# Patient Record
Sex: Female | Born: 2000 | Race: White | Hispanic: No | Marital: Single | Smoking: Never smoker
Health system: Southern US, Community
[De-identification: ages and names within clinical notes are randomized; demographics above are authoritative.]

## PROBLEM LIST (undated history)

## (undated) ENCOUNTER — Emergency Department (HOSPITAL_COMMUNITY): Admission: EM | Payer: Medicaid Other | Source: Home / Self Care

## (undated) ENCOUNTER — Ambulatory Visit

## (undated) DIAGNOSIS — J45909 Unspecified asthma, uncomplicated: Secondary | ICD-10-CM

## (undated) DIAGNOSIS — L309 Dermatitis, unspecified: Secondary | ICD-10-CM

## (undated) HISTORY — DX: Dermatitis, unspecified: L30.9

---
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
## 2021-02-06 ENCOUNTER — Emergency Department (HOSPITAL_COMMUNITY)
Admission: EM | Admit: 2021-02-06 | Discharge: 2021-02-06 | Disposition: A | Discharge status: Home / Self Care | Payer: Medicaid Other | Attending: Emergency Medicine | Admitting: Emergency Medicine

## 2021-02-06 ENCOUNTER — Other Ambulatory Visit: Payer: Self-pay

## 2021-02-06 ENCOUNTER — Encounter (HOSPITAL_COMMUNITY): Payer: Self-pay

## 2021-02-06 DIAGNOSIS — X58XXXA Exposure to other specified factors, initial encounter: Secondary | ICD-10-CM | POA: Diagnosis not present

## 2021-02-06 DIAGNOSIS — R0602 Shortness of breath: Secondary | ICD-10-CM | POA: Insufficient documentation

## 2021-02-06 DIAGNOSIS — T7840XA Allergy, unspecified, initial encounter: Secondary | ICD-10-CM | POA: Diagnosis not present

## 2021-02-06 DIAGNOSIS — J4520 Mild intermittent asthma, uncomplicated: Secondary | ICD-10-CM | POA: Insufficient documentation

## 2021-02-06 DIAGNOSIS — Z7982 Long term (current) use of aspirin: Secondary | ICD-10-CM | POA: Insufficient documentation

## 2021-02-06 MED ORDER — FAMOTIDINE 20 MG PO TABS
20.0000 mg | ORAL_TABLET | Freq: Two times a day (BID) | ORAL | 0 refills | Status: DC
Start: 2021-02-06 — End: 2021-04-05

## 2021-02-06 MED ORDER — ONDANSETRON HCL 4 MG/2ML IJ SOLN
4.0000 mg | Freq: Once | INTRAMUSCULAR | Status: AC
  Administered 2021-02-06: 4 mg via INTRAVENOUS
  Filled 2021-02-06: qty 2

## 2021-02-06 MED ORDER — PREDNISONE 50 MG PO TABS
50.0000 mg | ORAL_TABLET | Freq: Every day | ORAL | 0 refills | Status: DC
Start: 2021-02-06 — End: 2021-04-05

## 2021-02-06 MED ORDER — FAMOTIDINE IN NACL 20-0.9 MG/50ML-% IV SOLN
20.0000 mg | Freq: Once | INTRAVENOUS | Status: AC
  Administered 2021-02-06: 20 mg via INTRAVENOUS
  Filled 2021-02-06: qty 50

## 2021-02-06 MED ORDER — DIPHENHYDRAMINE HCL 50 MG/ML IJ SOLN
25.0000 mg | Freq: Once | INTRAMUSCULAR | Status: AC
  Administered 2021-02-06: 25 mg via INTRAVENOUS
  Filled 2021-02-06: qty 1

## 2021-02-06 MED ORDER — IPRATROPIUM-ALBUTEROL 0.5-2.5 (3) MG/3ML IN SOLN
3.0000 mL | Freq: Once | RESPIRATORY_TRACT | Status: AC
  Administered 2021-02-06: 3 mL via RESPIRATORY_TRACT
  Filled 2021-02-06: qty 3

## 2021-02-06 MED ORDER — DOXEPIN HCL 10 MG PO CAPS
10.0000 mg | ORAL_CAPSULE | Freq: Once | ORAL | Status: AC
  Administered 2021-02-06: 10 mg via ORAL
  Filled 2021-02-06: qty 1

## 2021-02-06 MED ORDER — DOXEPIN HCL 10 MG PO CAPS
10.0000 mg | ORAL_CAPSULE | Freq: Three times a day (TID) | ORAL | 0 refills | Status: DC
Start: 2021-02-06 — End: 2021-04-05

## 2021-02-06 MED ORDER — ONDANSETRON 8 MG PO TBDP
8.0000 mg | ORAL_TABLET | Freq: Once | ORAL | Status: AC
  Administered 2021-02-06: 8 mg via ORAL
  Filled 2021-02-06: qty 1

## 2021-02-06 MED ORDER — EPINEPHRINE 0.3 MG/0.3ML IJ SOAJ
0.3000 mg | Freq: Once | INTRAMUSCULAR | Status: AC
  Administered 2021-02-06: 0.3 mg via INTRAMUSCULAR
  Filled 2021-02-06: qty 0.3

## 2021-02-06 MED ORDER — METHYLPREDNISOLONE SODIUM SUCC 125 MG IJ SOLR
125.0000 mg | Freq: Once | INTRAMUSCULAR | Status: AC
  Administered 2021-02-06: 125 mg via INTRAVENOUS
  Filled 2021-02-06: qty 2

## 2021-02-06 NOTE — ED Notes (Signed)
C/O itching all over. Airway patent. No difficulty swallowing. VSS

## 2021-02-06 NOTE — Discharge Instructions (Addendum)
Take loratadine (Claritin) or cetirizine (Zyrtec) once a day.  Take diphenhydramine (Benadryl) every 4-6 hours as needed for itching that is not controlled by loratadine or cetirizine.  You may use your albuterol inhaler or nebulizer as needed if you have recurrence of wheezing.  Return if you are having any problems.

## 2021-02-06 NOTE — ED Triage Notes (Signed)
Pt to ED by POV from home with c/o an allergic reaction to an unknown substance. Pt reports head to toe hives.

## 2021-02-06 NOTE — ED Provider Notes (Signed)
[facility]-EMERGENCY DEPT Provider Note   CSN: [id number] Arrival date & time: 02/06/21  0447     History Chief Complaint  Patient presents with   Allergic Reaction    Monica [name] is a 20 y.o. female.  The history is provided by the patient.  Allergic Reaction She has history of asthma and comes in because she has an allergic reaction.  She came home from work about 3 hours ago and started breaking out in hives.  She has noted some shortness of breath like she is having an asthma attack.  She denies any difficulty swallowing.  She tried applying calamine lotion without any benefit.  She denies any unusual exposures.  She has never had an attack like this before.   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     Family History  Problem Relation Age of Onset   Asthma Mother    Asthma Father    Eczema Sister    Asthma Brother    Allergic rhinitis Neg Hx    Angioedema Neg Hx    Immunodeficiency Neg Hx    Urticaria Neg Hx     Social History   Tobacco Use   Smoking status: Never   Smokeless tobacco: Never  Vaping Use   Vaping Use: Never used  Substance Use Topics   Alcohol  use: No   Drug use: No    Types: Marijuana    Comment: Stopped 3 months ago    Home Medications Prior to Admission medications   Medication Sig Start Date End Date Taking? Authorizing Provider  albuterol (PROAIR HFA) 108 (90 Base) MCG/ACT inhaler Inhale 2 puffs into the lungs every 6 (six) hours as needed for wheezing or shortness of breath. 04/12/20   Particia Nearing, PA-C  albuterol (PROVENTIL) (2.5 MG/3ML) 0.083% nebulizer solution Take 3 mLs (2.5 mg total) by nebulization every 6 (six) hours as needed for wheezing or shortness of breath. 04/12/20   Particia Nearing, PA-C  aspirin EC 81 MG tablet 1 po bid x 10 days Patient taking differently: Take 81 mg by mouth daily.  08/21/18   Cammy Copa, MD  methocarbamol (ROBAXIN) 500 MG tablet Take 1 tablet (500 mg total) by mouth every 8 (eight) hours as needed for muscle spasms. 08/21/18   Cammy Copa, MD  Multiple Vitamin (MULTIVITAMIN WITH MINERALS) TABS tablet Take 1 tablet by mouth daily.    [provider]  phenazopyridine (PYRIDIUM) 200 MG tablet Take 1 tablet (200 mg total) by mouth 3 (three) times daily. 02/11/19   Cato Mulligan, NP  promethazine-dextromethorphan (PROMETHAZINE-DM) 6.25-15 MG/5ML syrup Take 5 mLs by mouth 4 (four) times daily as needed for cough. 04/12/20  Particia Nearing, PA-C    Allergies    Patient has no known allergies.  Review of Systems   Review of Systems  All other systems reviewed and are negative.  Physical Exam Updated Vital Signs BP (!) 155/131 (BP Location: Left Arm)   Pulse (!) 112   Temp 98.4 F (36.9 C) (Oral)   Resp 18   Ht 5\' 6"  (1.676 m)   Wt 117.9 kg   SpO2 100%   BMI 41.97 kg/m   Physical Exam Vitals and nursing note reviewed.  20 year old female, resting comfortably and in no acute distress. Vital signs are significant for elevated heart rate and blood pressure. Oxygen saturation is 100%, which is normal. Head is normocephalic and  atraumatic. PERRLA, EOMI. Oropharynx is clear.  There is no edema of the uvula or sublingual tissues.  There is no pooling of secretions.  Phonation is normal.  There is no stridor. Neck is nontender and supple without adenopathy or JVD. Back is nontender and there is no CVA tenderness. Lungs have a few scattered expiratory wheezes mainly in the lower lung fields.  There are no rales or rhonchi. Chest is nontender. Heart has regular rate and rhythm without murmur. Abdomen is soft, flat, nontender. Extremities have no cyanosis or edema, full range of motion is present. Skin: Rash is present on the trunk and on all 4 extremities.  It is difficult to assess as she has put calamine lotion all over her body.  However, and does appear to be urticarial.. Neurologic: Mental status is normal, cranial nerves are intact, moves all extremities equally.  ED Results / Procedures / Treatments    Procedures Procedures  CRITICAL CARE Performed by: 12 Total critical care time: 40 minutes Critical care time was exclusive of separately billable procedures and treating other patients. Critical care was necessary to treat or prevent imminent or life-threatening deterioration. Critical care was time spent personally by me on the following activities: development of treatment plan with patient and/or surrogate as well as nursing, discussions with consultants, evaluation of patient's response to treatment, examination of patient, obtaining history from patient or surrogate, ordering and performing treatments and interventions, ordering and review of laboratory studies, ordering and review of radiographic studies, pulse oximetry and re-evaluation of patient's condition.  Medications Ordered in ED Medications  EPINEPHrine (EPI-PEN) injection 0.3 mg (has no administration in time range)  diphenhydrAMINE (BENADRYL) injection 25 mg (has no administration in time range)  famotidine (PEPCID) IVPB 20 mg premix  (has no administration in time range)  methylPREDNISolone sodium succinate (SOLU-MEDROL) 125 mg/2 mL injection 125 mg (has no administration in time range)  ipratropium-albuterol (DUONEB) 0.5-2.5 (3) MG/3ML nebulizer solution 3 mL (has no administration in time range)  ondansetron (ZOFRAN) injection 4 mg (has no administration in time range)    ED Course  I have reviewed the triage vital signs and the nursing notes.  Pertinent labs & imaging results that were available during my care of the patient were reviewed by me and considered in my medical decision making (see chart for details).    MDM Rules/Calculators/A&P                         Acute allergic reaction which is also triggered a mild exacerbation of asthma.  Patient does not appear to be in any distress, but given bronchospasm associated with urticaria, she is given a dose of epinephrine intramuscularly.  Also given diphenhydramine, famotidine, methylprednisolone  intravenously.  Old records are reviewed, and she has no relevant past visits.  Following above-noted treatment, itching has not improved, but she no longer feels short of breath.  On reexam, she is actively scratching herself, but lungs are now clear.  She is given a dose of doxepin and additional diphenhydramine.  She will be observed in the ED for another 1-2 hours.  Anticipate discharge with prescription for short course of prednisone as well as doxepin.  Recommend over-the-counter second-generation antihistamine with diphenhydramine for breakthrough itching.  Patient asked about possible referral to allergist, advised I did not recommend seeing an allergist for first episode of urticaria.  Case is signed out to Dr. Denton Lank.  Final Clinical Impression(s) / ED Diagnoses Final diagnoses:  Allergic reaction, initial encounter    Rx / DC Orders ED Discharge Orders          Ordered    predniSONE (DELTASONE) 50 MG tablet  Daily        02/06/21 0719    doxepin (SINEQUAN) 10  MG capsule  3 times daily        02/06/21 0719    famotidine (PEPCID) 20 MG tablet  2 times daily        02/06/21 0721             Dione Booze, MD 02/06/21 [id number]

## 2021-02-06 NOTE — ED Provider Notes (Signed)
Signed out at 0730 that pt improved, do d/c home in 1 hr or so.   Patient notes feeling much improved in terms of earlier symptoms, no hives or rash, no itching. No sob or wheezing. No throat swelling. No chest pain or discomfort. Chest cta bil. Pharynx normal. No rash/hives.  Abd soft nt.   Pt requests med for nausea. Zofran po.  Po fluids.   Vitals normal. Pulse ox 99%.  Pt currently appears stable for d/c.   Return precautions provided.      Cathren Laine, MD 02/06/21 1010

## 2021-02-23 ENCOUNTER — Other Ambulatory Visit: Payer: Self-pay | Admitting: Family Medicine

## 2021-03-18 ENCOUNTER — Other Ambulatory Visit: Payer: Self-pay

## 2021-03-18 ENCOUNTER — Emergency Department (HOSPITAL_BASED_OUTPATIENT_CLINIC_OR_DEPARTMENT_OTHER)
Admission: EM | Admit: 2021-03-18 | Discharge: 2021-03-18 | Disposition: A | Discharge status: Home / Self Care | Payer: Medicaid Other | Attending: Emergency Medicine | Admitting: Emergency Medicine

## 2021-03-18 ENCOUNTER — Emergency Department (HOSPITAL_BASED_OUTPATIENT_CLINIC_OR_DEPARTMENT_OTHER): Payer: Medicaid Other | Admitting: Radiology

## 2021-03-18 ENCOUNTER — Encounter (HOSPITAL_BASED_OUTPATIENT_CLINIC_OR_DEPARTMENT_OTHER): Payer: Self-pay | Admitting: Emergency Medicine

## 2021-03-18 DIAGNOSIS — R0602 Shortness of breath: Secondary | ICD-10-CM | POA: Diagnosis present

## 2021-03-18 DIAGNOSIS — T7840XA Allergy, unspecified, initial encounter: Secondary | ICD-10-CM | POA: Diagnosis not present

## 2021-03-18 DIAGNOSIS — J45909 Unspecified asthma, uncomplicated: Secondary | ICD-10-CM | POA: Diagnosis not present

## 2021-03-18 DIAGNOSIS — Z7982 Long term (current) use of aspirin: Secondary | ICD-10-CM | POA: Diagnosis not present

## 2021-03-18 MED ORDER — ALBUTEROL SULFATE HFA 108 (90 BASE) MCG/ACT IN AERS
2.0000 | INHALATION_SPRAY | RESPIRATORY_TRACT | Status: DC | PRN
  Filled 2021-03-18: qty 6.7

## 2021-03-18 MED ORDER — EPINEPHRINE 0.3 MG/0.3ML IJ SOAJ: 0.3000 mg | INTRAMUSCULAR | 0 refills | Status: AC | PRN

## 2021-03-18 MED ORDER — IPRATROPIUM-ALBUTEROL 0.5-2.5 (3) MG/3ML IN SOLN
RESPIRATORY_TRACT | Status: AC
  Administered 2021-03-18: 3 mL via RESPIRATORY_TRACT
  Filled 2021-03-18: qty 3

## 2021-03-18 MED ORDER — PREDNISONE 10 MG (21) PO TBPK: ORAL_TABLET | Freq: Every day | ORAL | 0 refills | Status: DC

## 2021-03-18 MED ORDER — IPRATROPIUM-ALBUTEROL 0.5-2.5 (3) MG/3ML IN SOLN: 3.0000 mL | Freq: Once | RESPIRATORY_TRACT | Status: AC

## 2021-03-18 NOTE — Discharge Instructions (Signed)
Please follow-up both with your primary doctor and with the allergy and asthma specialist.  Take the steroids as prescribed.  Come back to ER if you develop worsening difficulty in breathing or other new concerning symptom.  If you have a severe anaphylactic reaction and never use the EpiPen, you should report to ER for assessment.

## 2021-03-18 NOTE — ED Provider Notes (Signed)
[facility] EMERGENCY DEPT Provider Note   CSN: [id number] Arrival date & time: 03/18/21  1144     History Chief Complaint  Patient presents with   Shortness of Breath    Monica [name] is a 20 y.o. female.  Presented to the emergency room with concern for shortness of breath and rash.  Patient states that over the past couple weeks has been having intermittent episodes of hives as well as difficulty breathing.  States that most mornings she wakes up with rash that goes over her entire body, itchy.  Has tried Benadryl with some relief.  Rash typically resolves eventually.  Also feels like she has been having frequent flares of her asthma.  Have to use her inhaler at home more often.  Currently she does not feel short of breath.  States that the rash completely resolved prior to arrival to ER.  Per review of chart patient was seen on 10/23 for allergic reaction.  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     Family History  Problem Relation Age of Onset   Asthma Mother    Asthma Father    Eczema Sister    Asthma Brother    Allergic rhinitis Neg Hx    Angioedema Neg Hx     Immunodeficiency Neg Hx    Urticaria Neg Hx     Social History   Tobacco Use   Smoking status: Never   Smokeless tobacco: Never  Vaping Use   Vaping Use: Never used  Substance Use Topics   Alcohol use: No   Drug use: No    Types: Marijuana    Comment: Stopped 3 months ago    Home Medications Prior to Admission medications   Medication Sig Start Date End Date Taking? Authorizing Provider  EPINEPHrine 0.3 mg/0.3 mL IJ SOAJ injection Inject 0.3 mg into the muscle as needed for anaphylaxis. 03/18/21  Yes Lucrezia Starch, MD  predniSONE (STERAPRED UNI-PAK 21 TAB) 10 MG (21) TBPK tablet Take by mouth daily. Take 6 tabs by mouth daily  for 1 day, then 5 tabs for 1 day, then 4 tabs for 1 day, then 3 tabs for 1 day, 2 tabs for 1 day, then 1 tab by mouth daily for 1 day 03/18/21  Yes Lucrezia Starch, MD  albuterol (PROAIR HFA) 108 (90 Base) MCG/ACT inhaler Inhale 2 puffs into the lungs every 6 (six) hours as needed for wheezing or shortness of breath. 04/12/20   Volney American, PA-C  albuterol (PROVENTIL) (2.5 MG/3ML) 0.083% nebulizer solution Take 3 mLs (2.5 mg total) by nebulization every 6 (six) hours as needed for wheezing or shortness of breath. 04/12/20   Orene Desanctis,  Lilia Argue, PA-C  aspirin EC 81 MG tablet 1 po bid x 10 days Patient not taking: No sig reported 08/21/18   Meredith Pel, MD  doxepin (SINEQUAN) 10 MG capsule Take 1 capsule (10 mg total) by mouth 3 (three) times daily. [id number]   Delora Fuel, MD  famotidine (PEPCID) 20 MG tablet Take 1 tablet (20 mg total) by mouth 2 (two) times daily. [id number]   Delora Fuel, MD  methocarbamol (ROBAXIN) 500 MG tablet Take 1 tablet (500 mg total) by mouth every 8 (eight) hours as needed for muscle spasms. Patient not taking: Reported on 02/06/2021 08/21/18   Meredith Pel, MD  phenazopyridine (PYRIDIUM) 200 MG tablet Take 1 tablet (200 mg total) by mouth 3 (three) times daily. Patient not taking: Reported on 02/06/2021  02/11/19   Archer Asa, NP  pimecrolimus (ELIDEL) 1 % cream Apply 1 application topically as needed (eczema).    [provider]  predniSONE (DELTASONE) 50 MG tablet Take 1 tablet (50 mg total) by mouth daily. [id number]   Delora Fuel, MD  promethazine-dextromethorphan (PROMETHAZINE-DM) 6.25-15 MG/5ML syrup Take 5 mLs by mouth 4 (four) times daily as needed for cough. Patient not taking: Reported on 02/06/2021 04/12/20   Volney American, PA-C    Allergies    Patient has no known allergies.  Review of Systems   Review of Systems  Constitutional:  Negative for chills and fever.  HENT:  Negative for ear pain and sore throat.   Eyes:  Negative for pain and visual disturbance.  Respiratory:  Positive for shortness of breath and wheezing. Negative for cough.   Cardiovascular:  Negative for chest pain and palpitations.  Gastrointestinal:  Negative for abdominal pain and vomiting.  Genitourinary:  Negative for dysuria and hematuria.  Musculoskeletal:  Negative for arthralgias and back pain.  Skin:  Positive for rash. Negative for color change.  Neurological:  Negative for seizures and syncope.  All other systems reviewed and are negative.  Physical Exam Updated Vital Signs BP (!) 133/57   Pulse 89   Temp 98.1 F (36.7 C) (Oral)   Resp (!) 34   Ht 5\' 5"  (1.651 m)   Wt 120.2 kg   LMP 03/17/2021   SpO2 100%   BMI 44.10 kg/m   Physical Exam Vitals and nursing note reviewed.  Constitutional:      General: She is not in acute distress.    Appearance: She is well-developed.  HENT:     Head: Normocephalic and atraumatic.  Eyes:     Conjunctiva/sclera: Conjunctivae normal.  Cardiovascular:     Rate and Rhythm: Normal rate and regular rhythm.     Heart sounds: No murmur heard. Pulmonary:     Effort: Pulmonary effort is normal.     Comments: Very slight end expiratory wheeze noted bilaterally Abdominal:     Palpations: Abdomen is soft.     Tenderness: There  is no abdominal tenderness.  Musculoskeletal:        General: No swelling.     Cervical back: Neck supple.  Skin:    General: Skin is warm and dry.     Capillary Refill: Capillary refill takes less than 2 seconds.     Comments: No rash appreciated  Neurological:     General: No focal deficit present.     Mental Status: She is alert.  Psychiatric:        Mood and Affect: Mood normal.    ED Results / Procedures / Treatments  Labs (all labs ordered are listed, but only abnormal results are displayed) Labs Reviewed - No data to display  EKG EKG Interpretation  Date/Time:  Friday March 18 2021 12:06:45 EST Ventricular Rate:  83 PR Interval:  156 QRS Duration: 93 QT Interval:  350 QTC Calculation: 412 R Axis:   65 Text Interpretation: Sinus rhythm Confirmed by Marianna Fuss ([id number]) on 03/18/2021 1:16:11 PM  Radiology DG Chest 2 View  Result Date: 03/18/2021 CLINICAL DATA:  Shortness of breath EXAM: CHEST - 2 VIEW COMPARISON:  Chest x-ray dated January 12, 2015 FINDINGS: The heart size and mediastinal contours are within normal limits. Mild bibasilar opacities, likely due to atelectasis. The visualized skeletal structures are unremarkable. IMPRESSION: No active cardiopulmonary disease. Electronically Signed   By: Allegra Lai M.D.   On: 03/18/2021 12:43    Procedures Procedures   Medications Ordered in ED Medications  albuterol (VENTOLIN HFA) 108 (90 Base) MCG/ACT inhaler 2 puff (has no administration in time range)  ipratropium-albuterol (DUONEB) 0.5-2.5 (3) MG/3ML nebulizer solution 3 mL (3 mLs Nebulization Given 03/18/21 1229)    ED Course  I have reviewed the triage vital signs and the nursing notes.  Pertinent labs & imaging results that were available during my care of the patient were reviewed by me and considered in my medical decision making (see chart for details).    MDM Rules/Calculators/A&P                           20 year old lady presented  to ER due to periodic episodes of shortness of breath and hives.  Has history of asthma.  On exam she appears well in no distress.  No active rash at present.  Did note slight expiratory wheeze.  Due to duration of symptoms, recommend trial of steroids.  Additionally recommended follow-up with primary care.  Patient states that she has previously been seen by [facility] of [facility].  Recommend she also follow-up in their clinic.  No signs or symptoms to suggest anaphylaxis today.  Per report from mother, patient did have anaphylactic reaction requiring EpiPen previously.  No longer have unexpired EpiPen, requesting EpiPen refill today.  Reviewed return precautions and discharged home.  After the discussed management above, the patient was determined to be safe for discharge.  The patient was in agreement with this plan and all questions regarding their care were answered.  ED return precautions were discussed and the patient will return to the ED with any significant worsening of condition.   Final Clinical Impression(s) / ED Diagnoses Final diagnoses:  Allergic reaction, initial encounter  Uncomplicated asthma, unspecified asthma severity, unspecified whether persistent    Rx / DC Orders ED Discharge Orders          Ordered    Ambulatory referral to Allergy       Comments: Recurrent allergic reactions with hx of asthma   03/18/21 1348    predniSONE (STERAPRED UNI-PAK 21 TAB) 10 MG (21) TBPK tablet  Daily        03/18/21 1405    EPINEPHrine 0.3 mg/0.3 mL IJ SOAJ injection  As needed        03/18/21 1405             Milagros Loll, MD 03/18/21 1416
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
## 2021-04-03 ENCOUNTER — Other Ambulatory Visit: Payer: Self-pay

## 2021-04-03 ENCOUNTER — Encounter (HOSPITAL_COMMUNITY): Payer: Self-pay

## 2021-04-03 ENCOUNTER — Emergency Department (HOSPITAL_COMMUNITY)
Admission: EM | Admit: 2021-04-03 | Discharge: 2021-04-04 | Disposition: A | Discharge status: Home / Self Care | Payer: Medicaid Other | Attending: Emergency Medicine | Admitting: Emergency Medicine

## 2021-04-03 DIAGNOSIS — Z7982 Long term (current) use of aspirin: Secondary | ICD-10-CM | POA: Diagnosis not present

## 2021-04-03 DIAGNOSIS — J45909 Unspecified asthma, uncomplicated: Secondary | ICD-10-CM | POA: Insufficient documentation

## 2021-04-03 DIAGNOSIS — N9489 Other specified conditions associated with female genital organs and menstrual cycle: Secondary | ICD-10-CM | POA: Diagnosis not present

## 2021-04-03 DIAGNOSIS — L509 Urticaria, unspecified: Secondary | ICD-10-CM | POA: Diagnosis not present

## 2021-04-03 DIAGNOSIS — R0602 Shortness of breath: Secondary | ICD-10-CM | POA: Diagnosis not present

## 2021-04-03 DIAGNOSIS — R21 Rash and other nonspecific skin eruption: Secondary | ICD-10-CM | POA: Diagnosis present

## 2021-04-03 MED ORDER — EPINEPHRINE 0.3 MG/0.3ML IJ SOAJ
0.3000 mg | Freq: Once | INTRAMUSCULAR | Status: AC
  Administered 2021-04-03: 0.3 mg via INTRAMUSCULAR
  Filled 2021-04-03: qty 0.3

## 2021-04-03 MED ORDER — DIPHENHYDRAMINE HCL 50 MG/ML IJ SOLN
25.0000 mg | Freq: Once | INTRAMUSCULAR | Status: AC
  Administered 2021-04-03: 25 mg via INTRAVENOUS
  Filled 2021-04-03: qty 1

## 2021-04-03 MED ORDER — METHYLPREDNISOLONE SODIUM SUCC 125 MG IJ SOLR
125.0000 mg | Freq: Once | INTRAMUSCULAR | Status: AC
  Administered 2021-04-03: 125 mg via INTRAVENOUS
  Filled 2021-04-03: qty 2

## 2021-04-03 MED ORDER — FAMOTIDINE IN NACL 20-0.9 MG/50ML-% IV SOLN
20.0000 mg | Freq: Once | INTRAVENOUS | Status: AC
  Administered 2021-04-03: 20 mg via INTRAVENOUS
  Filled 2021-04-03: qty 50

## 2021-04-03 NOTE — ED Provider Notes (Addendum)
[facility]-EMERGENCY DEPT Provider Note   CSN: [id number] Arrival date & time: 04/03/21  2319     History Chief Complaint  Patient presents with   Allergic Reaction    Monica [name] is a 20 y.o. female.  Patient to ED with recurrent hives and SOB that have been going on for over one month, usually daily. Mom states they have been seen in the ED several times and have an appointment with Asthma and Allergy in 2 days. Tonight, she broke out with hives to face and upper body, SOB with new symptom of chest pain radiating to the left arm. No vomiting. She is currently on a steroid taper, has an inhaler and nebulizer at home, taking Pepcid and Benadryl.   The history is provided by the patient and a parent. No language interpreter was used.  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     Family History  Problem Relation Age of Onset   Asthma Mother    Asthma Father    Eczema Sister    Asthma Brother    Allergic rhinitis Neg Hx    Angioedema Neg Hx    Immunodeficiency Neg Hx    Urticaria Neg Hx     Social History    Tobacco Use   Smoking status: Never   Smokeless tobacco: Never  Vaping Use   Vaping Use: Never used  Substance Use Topics   Alcohol use: No   Drug use: No    Types: Marijuana    Comment: Stopped 3 months ago    Home Medications Prior to Admission medications   Medication Sig Start Date End Date Taking? Authorizing Provider  albuterol (PROAIR HFA) 108 (90 Base) MCG/ACT inhaler Inhale 2 puffs into the lungs every 6 (six) hours as needed for wheezing or shortness of breath. 04/12/20   Particia Nearing, PA-C  albuterol (PROVENTIL) (2.5 MG/3ML) 0.083% nebulizer solution Take 3 mLs (2.5 mg total) by nebulization every 6 (six) hours as needed for wheezing or shortness of breath. 04/12/20   Particia Nearing, PA-C  aspirin EC 81 MG tablet 1 po bid x 10 days Patient not taking: No sig reported 08/21/18   Cammy Copa, MD  doxepin (SINEQUAN) 10 MG capsule Take 1 capsule (10 mg total) by mouth 3 (three) times daily. 02/06/21   Dione Booze, MD  EPINEPHrine 0.3 mg/0.3 mL IJ SOAJ injection Inject 0.3 mg into the muscle as needed for anaphylaxis. 03/18/21   Milagros Loll, MD  famotidine (PEPCID) 20 MG tablet Take 1 tablet (20 mg total) by mouth 2 (two) times daily. 02/06/21   Dione Booze,  MD  methocarbamol (ROBAXIN) 500 MG tablet Take 1 tablet (500 mg total) by mouth every 8 (eight) hours as needed for muscle spasms. Patient not taking: Reported on 02/06/2021 08/21/18   Cammy Copa, MD  phenazopyridine (PYRIDIUM) 200 MG tablet Take 1 tablet (200 mg total) by mouth 3 (three) times daily. Patient not taking: Reported on 02/06/2021 02/11/19   Cato Mulligan, NP  pimecrolimus (ELIDEL) 1 % cream Apply 1 application topically as needed (eczema).    [provider]  predniSONE (DELTASONE) 50 MG tablet Take 1 tablet (50 mg total) by mouth daily. 02/06/21   Dione Booze, MD  predniSONE (STERAPRED UNI-PAK 21 TAB) 10 MG (21) TBPK tablet Take by mouth daily. Take 6 tabs  by mouth daily  for 1 day, then 5 tabs for 1 day, then 4 tabs for 1 day, then 3 tabs for 1 day, 2 tabs for 1 day, then 1 tab by mouth daily for 1 day 03/18/21   Milagros Loll, MD  promethazine-dextromethorphan (PROMETHAZINE-DM) 6.25-15 MG/5ML syrup Take 5 mLs by mouth 4 (four) times daily as needed for cough. Patient not taking: Reported on 02/06/2021 04/12/20   Particia Nearing, PA-C    Allergies    Patient has no known allergies.  Review of Systems   Review of Systems  Constitutional:  Negative for chills and fever.  HENT: Negative.    Respiratory:  Positive for shortness of breath and wheezing.   Cardiovascular:  Positive for chest pain.  Gastrointestinal: Negative.  Negative for nausea and vomiting.  Musculoskeletal: Negative.   Skin:  Positive for rash.  Neurological: Negative.    Physical Exam Updated Vital Signs BP (!) 160/122 (BP Location: Right Arm)    Pulse (!) 121    Temp 98.1 F (36.7 C) (Oral)    Resp 20    Ht  (1.651 m)    Wt 120.2 kg    LMP 03/17/2021    SpO2 99%    BMI 44.10 kg/m   Physical Exam Vitals and nursing note reviewed.  Constitutional:      Appearance: She is well-developed. She is obese.  HENT:     Head: Normocephalic.  Cardiovascular:     Rate and Rhythm: Regular rhythm. Tachycardia present.     Heart sounds: No murmur heard. Pulmonary:     Effort: Pulmonary effort is normal.     Breath sounds: Wheezing present. No rhonchi or rales.  Abdominal:     General: Bowel sounds are normal.     Palpations: Abdomen is soft.     Tenderness: There is no abdominal tenderness. There is no guarding or rebound.  Musculoskeletal:        General: Normal range of motion.     Cervical back: Normal range of motion and neck supple.  Skin:    General: Skin is warm and dry.     Findings: Rash present.     Comments: Significant facial hives with scattered hives to upper body.   Neurological:     General: No focal deficit present.     Mental Status:  She is alert and oriented to person, place, and time.    ED Results / Procedures / Treatments   Labs (all labs ordered are listed, but only abnormal results are displayed) Labs Reviewed  CBC WITH DIFFERENTIAL/PLATELET  BASIC METABOLIC PANEL  URINALYSIS, ROUTINE W REFLEX MICROSCOPIC  I-STAT BETA HCG BLOOD, ED ([facility], [facility], [facility] ONLY)  TROPONIN I (HIGH SENSITIVITY)   Results for  orders placed or performed during the hospital encounter of 04/03/21  CBC with Differential  Result Value Ref Range   WBC 17.4 (H) 4.0 - 10.5 K/uL   RBC 5.02 3.87 - 5.11 MIL/uL   Hemoglobin 14.1 12.0 - 15.0 g/dL   HCT 67.3 41.9 - 37.9 %   MCV 84.1 80.0 - 100.0 fL   MCH 28.1 26.0 - 34.0 pg   MCHC 33.4 30.0 - 36.0 g/dL   RDW 02.4 09.7 - 35.3 %   Platelets 432 (H) 150 - 400 K/uL   nRBC 0.0 0.0 - 0.2 %   Neutrophils Relative % 74 %   Neutro Abs 12.9 (H) 1.7 - 7.7 K/uL   Lymphocytes Relative 16 %   Lymphs Abs 2.8 0.7 - 4.0 K/uL   Monocytes Relative 7 %   Monocytes Absolute 1.3 (H) 0.1 - 1.0 K/uL   Eosinophils Relative 2 %   Eosinophils Absolute 0.3 0.0 - 0.5 K/uL   Basophils Relative 0 %   Basophils Absolute 0.1 0.0 - 0.1 K/uL   Immature Granulocytes 1 %   Abs Immature Granulocytes 0.16 (H) 0.00 - 0.07 K/uL  Basic metabolic panel  Result Value Ref Range   Sodium 137 135 - 145 mmol/L   Potassium 4.0 3.5 - 5.1 mmol/L   Chloride 103 98 - 111 mmol/L   CO2 26 22 - 32 mmol/L   Glucose, Bld 98 70 - 99 mg/dL   BUN 14 6 - 20 mg/dL   Creatinine, Ser 2.99 0.44 - 1.00 mg/dL   Calcium 8.7 (L) 8.9 - 10.3 mg/dL   GFR, Estimated >24 >26 mL/min   Anion gap 8 5 - 15  I-Stat beta hCG blood, ED  Result Value Ref Range   I-stat hCG, quantitative <5.0 <5 mIU/mL   Comment 3          Troponin I (High Sensitivity)  Result Value Ref Range   Troponin I (High Sensitivity) 2 <18 ng/L    EKG None  Radiology No results found.  Procedures Procedures   Medications Ordered in ED Medications  EPINEPHrine (EPI-PEN)  injection 0.3 mg (has no administration in time range)  methylPREDNISolone sodium succinate (SOLU-MEDROL) 125 mg/2 mL injection 125 mg (has no administration in time range)  famotidine (PEPCID) IVPB 20 mg premix (has no administration in time range)  diphenhydrAMINE (BENADRYL) injection 25 mg (has no administration in time range)    ED Course  I have reviewed the triage vital signs and the nursing notes.  Pertinent labs & imaging results that were available during my care of the patient were reviewed by me and considered in my medical decision making (see chart for details).    MDM Rules/Calculators/A&P                         Patient to ED with hives, SOB - recurrent symptoms, now with CP.   11:50 - The patient arrives with significant hives, mild wheezing, no hypoxia, appears uncomfortable but not distressed. Epi SQ, IV solumedrol, IV Benadryl, IV Pepcid provided. Will observe for improvement.   1:10 - Patient continues to have itching with hives now spreading to LE's. Additional Benadryl provided.   2:10 - Symptoms continue. Brief period of improvement followed by recurrent hives. No wheezing or pain. Repeat Epi ordered.   3:40 - feeling much better. No hives currently. No itching.  4:20 - still without symptoms. Feel is there is no rebound at 3 hours after las  Epi, will discharge home. She has an appointment in 2 days with allergist.   CRITICAL CARE Performed by: Arnoldo Hooker   Total critical care time: 50 minutes  Critical care time was exclusive of separately billable procedures and treating other patients.  Critical care was necessary to treat or prevent imminent or life-threatening deterioration.  Critical care was time spent personally by me on the following activities: development of treatment plan with patient and/or surrogate as well as nursing, discussions with consultants, evaluation of patient's response to treatment, examination of patient, obtaining history  from patient or surrogate, ordering and performing treatments and interventions, ordering and review of laboratory studies, ordering and review of radiographic studies, pulse oximetry and re-evaluation of patient's condition.        Final Clinical Impression(s) / ED Diagnoses Final diagnoses:  None   Hives, recurrent  Rx / DC Orders ED Discharge Orders     None        Elpidio Anis, PA-C 04/04/21 0549    Elpidio Anis, PA-C 04/04/21 0550    Charlynne Pander, MD 04/04/21 [phone]

## 2021-04-03 NOTE — ED Triage Notes (Signed)
Pt reports with hives all over, shortness of breath, and chest pain since earlier today. Pt states that she has been seeing a provider and is to have allergy testing soon.

## 2021-04-04 ENCOUNTER — Emergency Department (HOSPITAL_COMMUNITY): Payer: Medicaid Other

## 2021-04-04 LAB — CBC WITH DIFFERENTIAL/PLATELET
Abs Immature Granulocytes: 0.16 10*3/uL — ABNORMAL HIGH (ref 0.00–0.07)
Basophils Absolute: 0.1 10*3/uL (ref 0.0–0.1)
Basophils Relative: 0 %
Eosinophils Absolute: 0.3 10*3/uL (ref 0.0–0.5)
Eosinophils Relative: 2 %
HCT: 42.2 % (ref 36.0–46.0)
Hemoglobin: 14.1 g/dL (ref 12.0–15.0)
Immature Granulocytes: 1 %
Lymphocytes Relative: 16 %
Lymphs Abs: 2.8 10*3/uL (ref 0.7–4.0)
MCH: 28.1 pg (ref 26.0–34.0)
MCHC: 33.4 g/dL (ref 30.0–36.0)
MCV: 84.1 fL (ref 80.0–100.0)
Monocytes Absolute: 1.3 10*3/uL — ABNORMAL HIGH (ref 0.1–1.0)
Monocytes Relative: 7 %
Neutro Abs: 12.9 10*3/uL — ABNORMAL HIGH (ref 1.7–7.7)
Neutrophils Relative %: 74 %
Platelets: 432 10*3/uL — ABNORMAL HIGH (ref 150–400)
RBC: 5.02 MIL/uL (ref 3.87–5.11)
RDW: 13.6 % (ref 11.5–15.5)
WBC: 17.4 10*3/uL — ABNORMAL HIGH (ref 4.0–10.5)
nRBC: 0 % (ref 0.0–0.2)

## 2021-04-04 LAB — BASIC METABOLIC PANEL
Anion gap: 8 (ref 5–15)
BUN: 14 mg/dL (ref 6–20)
CO2: 26 mmol/L (ref 22–32)
Calcium: 8.7 mg/dL — ABNORMAL LOW (ref 8.9–10.3)
Chloride: 103 mmol/L (ref 98–111)
Creatinine, Ser: 0.93 mg/dL (ref 0.44–1.00)
GFR, Estimated: >60 mL/min (ref >60)
Glucose, Bld: 98 mg/dL (ref 70–99)
Potassium: 4 mmol/L (ref 3.5–5.1)
Sodium: 137 mmol/L (ref 135–145)

## 2021-04-04 LAB — URINALYSIS, ROUTINE W REFLEX MICROSCOPIC
Bilirubin Urine: NEGATIVE
Glucose, UA: NEGATIVE mg/dL
Hgb urine dipstick: NEGATIVE
Ketones, ur: NEGATIVE mg/dL
Leukocytes,Ua: NEGATIVE
Nitrite: NEGATIVE
Protein, ur: NEGATIVE mg/dL
Specific Gravity, Urine: 1.014 (ref 1.005–1.030)
pH: 6 (ref 5.0–8.0)

## 2021-04-04 LAB — I-STAT BETA HCG BLOOD, ED (MC, WL, AP ONLY): I-stat hCG, quantitative: <5 m[IU]/mL (ref <5)

## 2021-04-04 LAB — TROPONIN I (HIGH SENSITIVITY): Troponin I (High Sensitivity): 2 ng/L (ref <18)

## 2021-04-04 MED ORDER — DIPHENHYDRAMINE HCL 50 MG/ML IJ SOLN
25.0000 mg | Freq: Once | INTRAMUSCULAR | Status: AC
  Administered 2021-04-04: 01:00:00 25 mg via INTRAVENOUS
  Filled 2021-04-04: qty 1

## 2021-04-04 MED ORDER — SODIUM CHLORIDE 0.9 % IV BOLUS
1000.0000 mL | Freq: Once | INTRAVENOUS | Status: AC
  Administered 2021-04-04: 04:00:00 1000 mL via INTRAVENOUS

## 2021-04-04 MED ORDER — EPINEPHRINE 0.3 MG/0.3ML IJ SOAJ
0.3000 mg | Freq: Once | INTRAMUSCULAR | Status: AC
  Administered 2021-04-04: 03:00:00 0.3 mg via INTRAMUSCULAR
  Filled 2021-04-04: qty 0.3

## 2021-04-04 NOTE — Progress Notes (Signed)
New Patient Note  RE: Monica [name] MRN: [id number] DOB: [birth year] Date of Office Visit: 04/05/2021  Consult requested by: Lucrezia Starch, MD Primary care provider: System, Provider Not In  Chief Complaint: Urticaria (X 3 months ), Shortness of Breath, and Eczema  History of Present Illness: I had the pleasure of seeing Monica [name] for initial evaluation at the [facility] on 04/05/2021. She is a 20 y.o. female, who is referred here by Dr. Roslynn Amble [facility]) for the evaluation of hives and asthma. She is accompanied today by her mother who provided/contributed to the history.   Last visit was on 03/27/2016 for allergic rhino conjunctivitis, asthma by Dr. Nelva Bush.   Rash started about 3 months ago. This can occur anywhere on her body. Describes them as itchy, red, raised, feels warm. Individual rashes lasts about a few hours. Some ecchymosis upon resolution. Associated symptoms include: trouble breathing, migraines, fatigue.  Frequency of episodes: daily. Suspected triggers are unknown. Patient had URI symptoms in November after the hives. Denies any fevers, chills, changes in medications, foods, personal care products. She has tried the following therapies: Claritin and Benadryl with some benefit. Systemic steroids yes. Currently on loratadine 10mg  daily.  She went to the ER multiple times and had to use Epipen at times.   Previous work up includes: 2022 CBC diff (slightly elevated WBC) and BMP. Previous history of rash/hives: no. Mild eczema as a child.  Patient is up to date with the following cancer screening tests: physical exam.  Asthma:  She reports symptoms of chest tightness, shortness of breath, coughing, wheezing for 17 years. Current medications include albuterol prn which help. She reports using aerochamber with inhalers. She tried the following inhalers: Qvar. Main triggers are unknown. In the last month, frequency of symptoms: daily - 4-5 times.  Frequency of nocturnal symptoms: 0x/month. Frequency of SABA use: daily.  In the last 12 months, emergency room visits/urgent care visits/doctor office visits or hospitalizations due to respiratory issues: multiple times. In the last 12 months, oral steroids courses: 2. Lifetime history of hospitalization for respiratory issues: no. Prior intubations: no. History of pneumonia: no. She was evaluated by allergist in the past. Smoking exposure: 3 cigs per day. Up to date with flu vaccine: no. Up to date with COVID-19 vaccine: no. Prior Covid-19 infection: no. History of reflux: denies.  03/18/2021 CXR: "IMPRESSION: No active cardiopulmonary disease."  02/06/2021 ER visit: "She has history of asthma and comes in because she has an allergic reaction.  She came home from work about 3 hours ago and started breaking out in hives.  She has noted some shortness of breath like she is having an asthma attack.  She denies any difficulty swallowing.  She tried applying calamine lotion without any benefit.  She denies any unusual exposures.  She has never had an attack like this before."  03/18/2021 ER visit: "Monica [name] is a 20 y.o. female.  Presented to the emergency room with concern for shortness of breath and rash.  Patient states that over the past couple weeks has been having intermittent episodes of hives as well as difficulty breathing.  States that most mornings she wakes up with rash that goes over her entire body, itchy.  Has tried Benadryl with some relief.  Rash typically resolves eventually.  Also feels like she has been having frequent flares of her asthma.  Have to use her inhaler at home more often.  Currently she does not feel short  of breath.  States that the rash completely resolved prior to arrival to ER.  Per review of chart patient was seen on 10/23 for allergic reaction"  04/03/2021 ER visit: "Patient to ED with recurrent hives and SOB that have been going on for over one month, usually  daily. Mom states they have been seen in the ED several times and have an appointment with Asthma and All[facility] in 2 days. Tonight, she broke out with hives to face and upper body, SOB with new symptom of chest pain radiating to the left arm. No vomiting. She is currently on a steroid taper, has an inhaler and nebulizer at home, taking Pepcid and Benadryl.   Patient to ED with hives, SOB - recurrent symptoms, now with CP.    11:50 - The patient arrives with significant hives, mild wheezing, no hypoxia, appears uncomfortable but not distressed. Epi SQ, IV solumedrol, IV Benadryl, IV Pepcid provided. Will observe for improvement.    1:10 - Patient continues to have itching with hives now spreading to LE's. Additional Benadryl provided.    2:10 - Symptoms continue. Brief period of improvement followed by recurrent hives. No wheezing or pain. Repeat Epi ordered.    3:40 - feeling much better. No hives currently. No itching.   4:20 - still without symptoms. Feel is there is no rebound at 3 hours after las Epi, will discharge home. She has an appointment in 2 days with allergist."  Assessment and Plan: Monica [name] is a 20 y.o. female with: Urticaria Almost daily urticaria for the last 3 months.  Denies any changes in medications, diet or personal care products.  Currently using loratadine 10 mg daily and prednisone.  Multiple ER visits with EpiPen use with some benefit.  No specific triggers noted. Associated asthma flares.  Unable to skin test today due to recent antihistamine intake. Keep track of episodes and take pictures of the rash.  Finish prednisone as prescribed.  Start zyrtec (cetirizine) 10mg  twice a day. If symptoms are not controlled or causes drowsiness let us know. Start Pepcid (famotidine) 20mg  twice a day.  Avoid the following potential triggers: alcohol, tight clothing, NSAIDs, hot showers and getting overheated. Get bloodwork as below. For mild symptoms you can take over the counter  antihistamines such as Benadryl and monitor symptoms closely. If symptoms worsen or if you have severe symptoms including breathing issues, throat closure, significant swelling, whole body hives, severe diarrhea and vomiting, lightheadedness then inject epinephrine and seek immediate medical care afterwards. Action plan given.  If above regimen is not effective, will start omalizumab next.   Asthma, not well controlled Diagnosed with asthma at age 58 and currently using albuterol on a daily basis with some benefit.  No recent ICS inhaler.  Smokes 3 cigarettes/day.  No prior COVID infection.  Denies reflux. Today's spirometry showed some mild obstruction with 3% improvement in FEV1 post bronchodilator treatment.  Clinically feeling unchanged. Daily controller medication(s): START Advair 115 mcg 2 puffs twice a day with spacer and rinse mouth afterwards. During upper respiratory infections/asthma flares:  If you need to use your albuterol nebulizer machine back to back within 15-30 minutes with no relief then please go to the ER/urgent care for further evaluation.  May use albuterol rescue inhaler 2 puffs or nebulizer every 4 to 6 hours as needed for shortness of breath, chest tightness, coughing, and wheezing. May use albuterol rescue inhaler 2 puffs 5 to 15 minutes prior to strenuous physical activities. Monitor frequency of use.  Get  spirometry at next visit.  Other allergic rhinitis 2017 skin testing positive to trees, ragweed, weed, mold, cat, dog, cockroach, dust mites. Unable to skin test today due to recent antihistamine intake. Get bloodwork. Continue environmental control measures.  The zyrtec prescribed as above should also help with these symptoms.  Return in about 4 weeks (around 05/03/2021).  Meds ordered this encounter  Medications   fluticasone-salmeterol (ADVAIR HFA) 115-21 MCG/ACT inhaler    Sig: Inhale 2 puffs into the lungs 2 (two) times daily. with spacer and rinse mouth  afterwards.    Dispense:  1 each    Refill:  5   cetirizine (ZYRTEC ALLERGY) 10 MG tablet    Sig: Take 1 tablet (10 mg total) by mouth 2 (two) times daily.    Dispense:  60 tablet    Refill:  2   famotidine (PEPCID) 20 MG tablet    Sig: Take 1 tablet (20 mg total) by mouth 2 (two) times daily.    Dispense:  60 tablet    Refill:  2   albuterol (VENTOLIN HFA) 108 (90 Base) MCG/ACT inhaler    Sig: Inhale 2 puffs into the lungs every 4 (four) hours as needed for wheezing or shortness of breath (coughing fits).    Dispense:  18 g    Refill:  1   albuterol (PROVENTIL) (2.5 MG/3ML) 0.083% nebulizer solution    Sig: Take 3 mLs (2.5 mg total) by nebulization every 4 (four) hours as needed for wheezing or shortness of breath (coughing fits).    Dispense:  75 mL    Refill:  1   Lab Orders         Allergens w/Total IgE Area 2         Alpha-Gal Panel         ANA w/Reflex         CBC with Differential/Platelet         Chronic Urticaria         C3 and C4         Comprehensive metabolic panel         C-reactive protein         Sedimentation rate         Tryptase         Food Allergy Profile      Other allergy screening: Rhino conjunctivitis: yes 2017 skin testing positive to trees, ragweed, weed, mold, cat, dog, cockroach, dust mites. Food allergy: no No recent tick bites. Medication allergy: no Hymenoptera allergy: no History of recurrent infections suggestive of immunodeficency: no  Diagnostics: Spirometry:  Tracings reviewed. Her effort: Good reproducible efforts. FVC: 3.17L FEV1: 2.40L, 76% predicted FEV1/FVC ratio: 76% Interpretation: Spirometry consistent with mild obstructive disease with 3% improvement in FEV1 post bronchodilator treatment. Clinically feeling unchanged.   Please see scanned spirometry results for details.  Skin Testing: Deferred due to recent antihistamines use.  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   Suicide attempt (Monica [name]) 02/08/2017   MDD (major depressive disorder) 02/08/2017   Allergic rhinoconjunctivitis 03/27/2016   Mild intermittent asthma, uncomplicated [id number]   Flexural atopic dermatitis 03/27/2016   Past Medical History:  Diagnosis Date   Asthma    Eczema    Urticaria 04/05/2021  Past Surgical History: Past Surgical History:  Procedure Laterality Date   ORIF ANKLE FRACTURE Right 09/03/2018   Procedure: RIGHT OPEN REDUCTION INTERNAL FIXATION (ORIF) ANKLE FRACTURE;  Surgeon: Meredith Pel, MD;  Location: [facility];  Service: Orthopedics;  Laterality: Right;   WISDOM TOOTH EXTRACTION  11/2016   Medication List:  Current Outpatient Medications  Medication Sig Dispense Refill   albuterol (PROVENTIL) (2.5 MG/3ML) 0.083% nebulizer solution Take 3 mLs (2.5 mg total) by nebulization every 4 (four) hours as needed for wheezing or shortness of breath (coughing fits). 75 mL 1   albuterol (VENTOLIN HFA) 108 (90 Base) MCG/ACT inhaler Inhale 2 puffs into the lungs every 4 (four) hours as needed for wheezing or shortness of breath (coughing fits). 18 g 1   cetirizine (ZYRTEC ALLERGY) 10 MG tablet Take 1 tablet (10 mg total) by mouth 2 (two) times daily. 60 tablet 2   EPINEPHrine 0.3 mg/0.3 mL IJ SOAJ injection Inject 0.3 mg into the muscle as needed for anaphylaxis. 1 each 0   famotidine (PEPCID) 20 MG tablet Take 1 tablet (20 mg total) by mouth 2 (two) times daily. 60 tablet 2   fluticasone (FLONASE) 50 MCG/ACT nasal spray Place 1 spray into both nostrils 2 (two) times daily.     fluticasone-salmeterol (ADVAIR HFA) 115-21 MCG/ACT inhaler Inhale 2 puffs into the lungs 2 (two) times daily. with spacer and rinse mouth afterwards. 1 each 5   pimecrolimus  (ELIDEL) 1 % cream Apply 1 application topically as needed (eczema).     predniSONE (STERAPRED UNI-PAK 21 TAB) 10 MG (21) TBPK tablet Take by mouth daily. Take 6 tabs by mouth daily  for 1 day, then 5 tabs for 1 day, then 4 tabs for 1 day, then 3 tabs for 1 day, 2 tabs for 1 day, then 1 tab by mouth daily for 1 day 21 tablet 0   triamcinolone cream (KENALOG) 0.1 % SMARTSIG:Sparingly Topical Twice Daily     No current facility-administered medications for this visit.   Allergies: No Known Allergies Social History: Social History   Socioeconomic History   Marital status: Single    Spouse name: Not on file   Number of children: Not on file   Years of education: Not on file   Highest education level: Not on file  Occupational History   Not on file  Tobacco Use   Smoking status: Never   Smokeless tobacco: Never  Vaping Use   Vaping Use: Never used  Substance and Sexual Activity   Alcohol use: No   Drug use: No    Types: Marijuana    Comment: Stopped 3 months ago   Sexual activity: Never  Other Topics Concern   Not on file  Social History Narrative   Not on file   Social Determinants of Health   Financial Resource Strain: Not on file  Food Insecurity: Not on file  Transportation Needs: Not on file  Physical Activity: Not on file  Stress: Not on file  Social Connections: Not on file   Lives in a 19 year old house. 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  HENT:  Negative for congestion and rhinorrhea.   Eyes:  Negative for itching.  Respiratory:  Positive for cough, chest tightness, shortness of breath and wheezing.   Cardiovascular:  Negative for chest pain.  Gastrointestinal:  Positive for diarrhea. Negative for abdominal pain.  Genitourinary:  Negative for difficulty urinating.  Skin:  Positive for rash.  Neurological:  Negative for headaches.   Objective: BP 118/76    Pulse 99    Temp 97.9 F (36.6 C) (Temporal)    Resp 20    Ht 5\' 7"  (1.702 m)    Wt 273 lb (123.8 kg)    LMP 03/17/2021    SpO2 99%    BMI 42.76 kg/m  Body mass index is 42.76 kg/m. Physical Exam Vitals and nursing note reviewed.  Constitutional:      Appearance: Normal appearance. She is well-developed.  HENT:     Head: Normocephalic and atraumatic.     Right Ear: Tympanic membrane and external ear normal.     Left Ear: Tympanic membrane and external ear normal.     Nose: Nose normal.     Mouth/Throat:     Mouth: Mucous membranes are moist.     Pharynx: Oropharynx is clear.  Eyes:     Conjunctiva/sclera: Conjunctivae normal.  Cardiovascular:     Rate and Rhythm: Normal rate and regular rhythm.     Heart sounds: Normal heart sounds. No murmur heard.   No friction rub. No gallop.  Pulmonary:     Effort: Pulmonary effort is normal.     Breath sounds: Normal breath sounds. No wheezing, rhonchi or rales.  Musculoskeletal:     Cervical back: Neck supple.  Skin:    General: Skin is warm.     Findings: Rash present.     Comments: Scattered small circular hives on the legs and arms b/l.   Neurological:     Mental Status: She is alert and oriented to person, place, and time.  Psychiatric:        Behavior: Behavior normal.   The plan was reviewed with the patient/family, and all questions/concerned were addressed.  It was my pleasure to see Jaryiah today and participate in her care. Please feel free to contact me with any questions or  concerns.  Sincerely,  Rosie Fate, DO Allergy & Immunology  [facility] office: [phone] [facility] office: [phone]

## 2021-04-04 NOTE — Discharge Instructions (Signed)
Continue your home medications and keep your scheduled appointment with the allergist this Tuesday.   Return to the ED with any recurrent or concerning symptoms at any time.

## 2021-04-04 NOTE — ED Notes (Addendum)
Pt stated that she thinks the benadryl is making her reaction worse with new hives in different places on her body. RN notified

## 2021-04-05 ENCOUNTER — Other Ambulatory Visit: Payer: Self-pay

## 2021-04-05 ENCOUNTER — Encounter: Payer: Self-pay | Admitting: Allergy

## 2021-04-05 ENCOUNTER — Ambulatory Visit (INDEPENDENT_AMBULATORY_CARE_PROVIDER_SITE_OTHER): Payer: Medicaid Other | Admitting: Allergy

## 2021-04-05 VITALS — BP 118/76 | HR 99 | Temp 97.9°F | Resp 20 | Ht 67.0 in | Wt 273.0 lb

## 2021-04-05 DIAGNOSIS — L509 Urticaria, unspecified: Secondary | ICD-10-CM | POA: Diagnosis not present

## 2021-04-05 DIAGNOSIS — J3089 Other allergic rhinitis: Secondary | ICD-10-CM | POA: Diagnosis not present

## 2021-04-05 DIAGNOSIS — J45909 Unspecified asthma, uncomplicated: Secondary | ICD-10-CM

## 2021-04-05 HISTORY — DX: Urticaria, unspecified: L50.9

## 2021-04-05 MED ORDER — ALBUTEROL SULFATE (2.5 MG/3ML) 0.083% IN NEBU: 2.5000 mg | INHALATION_SOLUTION | RESPIRATORY_TRACT | 1 refills | Status: AC | PRN

## 2021-04-05 MED ORDER — ALBUTEROL SULFATE HFA 108 (90 BASE) MCG/ACT IN AERS: 2.0000 | INHALATION_SPRAY | RESPIRATORY_TRACT | 1 refills | Status: DC | PRN

## 2021-04-05 MED ORDER — ADVAIR HFA 115-21 MCG/ACT IN AERO: 2.0000 | INHALATION_SPRAY | Freq: Two times a day (BID) | RESPIRATORY_TRACT | 5 refills | Status: DC

## 2021-04-05 MED ORDER — FAMOTIDINE 20 MG PO TABS: 20.0000 mg | ORAL_TABLET | Freq: Two times a day (BID) | ORAL | 2 refills | Status: DC

## 2021-04-05 MED ORDER — CETIRIZINE HCL 10 MG PO TABS: 10.0000 mg | ORAL_TABLET | Freq: Two times a day (BID) | ORAL | 2 refills | Status: AC

## 2021-04-05 NOTE — Assessment & Plan Note (Signed)
Diagnosed with asthma at age 20 and currently using albuterol on a daily basis with some benefit.  No recent ICS inhaler.  Smokes 3 cigarettes/day.  No prior COVID infection.  Denies reflux.  Today's spirometry showed some mild obstruction with 3% improvement in FEV1 post bronchodilator treatment.  Clinically feeling unchanged.  Daily controller medication(s): START Advair 115 mcg 2 puffs twice a day with spacer and rinse mouth afterwards.  During upper respiratory infections/asthma flares:  o If you need to use your albuterol nebulizer machine back to back within 15-30 minutes with no relief then please go to the ER/urgent care for further evaluation.   May use albuterol rescue inhaler 2 puffs or nebulizer every 4 to 6 hours as needed for shortness of breath, chest tightness, coughing, and wheezing. May use albuterol rescue inhaler 2 puffs 5 to 15 minutes prior to strenuous physical activities. Monitor frequency of use.   Get spirometry at next visit.

## 2021-04-05 NOTE — Assessment & Plan Note (Addendum)
Almost daily urticaria for the last 3 months.  Denies any changes in medications, diet or personal care products.  Currently using loratadine 10 mg daily and prednisone.  Multiple ER visits with EpiPen use with some benefit.  No specific triggers noted. Associated asthma flares.   Unable to skin test today due to recent antihistamine intake.  Keep track of episodes and take pictures of the rash.   Finish prednisone as prescribed.   Start zyrtec (cetirizine) 10mg  twice a day.  If symptoms are not controlled or causes drowsiness let know.  Start Pepcid (famotidine) 20mg  twice a day.   Avoid the following potential triggers: alcohol, tight clothing, NSAIDs, hot showers and getting overheated.  Get bloodwork as below.  For mild symptoms you can take over the counter antihistamines such as Benadryl and monitor symptoms closely. If symptoms worsen or if you have severe symptoms including breathing issues, throat closure, significant swelling, whole body hives, severe diarrhea and vomiting, lightheadedness then inject epinephrine and seek immediate medical care afterwards.  Action plan given.   If above regimen is not effective, will start omalizumab next.

## 2021-04-05 NOTE — Patient Instructions (Addendum)
Hives: Keep track of episodes and take pictures of the rash.  Start zyrtec (cetirizine) 10mg  twice a day. If symptoms are not controlled or causes drowsiness let know. Start pepcid (famotidine) 20mg  twice a day.  Avoid the following potential triggers: alcohol, tight clothing, NSAIDs, hot showers and getting overheated. Get bloodwork:  We are ordering labs, so please allow 1-2 weeks for the results to come back. With the newly implemented Cures Act, the labs might be visible to you at the same time that they become visible to me. However, I will not address the results until all of the results are back, so please be patient.   For mild symptoms you can take over the counter antihistamines such as Benadryl and monitor symptoms closely. If symptoms worsen or if you have severe symptoms including breathing issues, throat closure, significant swelling, whole body hives, severe diarrhea and vomiting, lightheadedness then inject epinephrine and seek immediate medical care afterwards. Action plan given.   Asthma  Daily controller medication(s): START Advair 115 mcg 2 puffs twice a day with spacer and rinse mouth afterwards. During upper respiratory infections/asthma flares:  If you need to use your albuterol nebulizer machine back to back within 15-30 minutes with no relief then please go to the ER/urgent care for further evaluation.  May use albuterol rescue inhaler 2 puffs or nebulizer every 4 to 6 hours as needed for shortness of breath, chest tightness, coughing, and wheezing. May use albuterol rescue inhaler 2 puffs 5 to 15 minutes prior to strenuous physical activities. Monitor frequency of use.  Asthma control goals:  Full participation in all desired activities (may need albuterol before activity) Albuterol use two times or less a week on average (not counting use with activity) Cough interfering with sleep two times or less a month Oral steroids no more than once a year No hospitalizations    Follow up in 4 weeks or sooner if needed.  Skin care recommendations  Bath time: Always use lukewarm water. AVOID very hot or cold water. Keep bathing time to 5-10 minutes. Do NOT use bubble bath. Use a mild soap and use just enough to wash the dirty areas. Do NOT scrub skin vigorously.  After bathing, pat dry your skin with a towel. Do NOT rub or scrub the skin.  Moisturizers and prescriptions:  ALWAYS apply moisturizers immediately after bathing (within 3 minutes). This helps to lock-in moisture. Use the moisturizer several times a day over the whole body. Good summer moisturizers include: Aveeno, CeraVe, Cetaphil. Good winter moisturizers include: Aquaphor, Vaseline, Cerave, Cetaphil, Eucerin, Vanicream. When using moisturizers along with medications, the moisturizer should be applied about one hour after applying the medication to prevent diluting effect of the medication or moisturize around where you applied the medications. When not using medications, the moisturizer can be continued twice daily as maintenance.  Laundry and clothing: Avoid laundry products with added color or perfumes. Use unscented hypo-allergenic laundry products such as Tide free, Cheer free & gentle, and All free and clear.  If the skin still seems dry or sensitive, you can try double-rinsing the clothes. Avoid tight or scratchy clothing such as wool. Do not use fabric softeners or dyer sheets.

## 2021-04-05 NOTE — Assessment & Plan Note (Signed)
2017 skin testing positive to trees, ragweed, weed, mold, cat, dog, cockroach, dust mites.  Unable to skin test today due to recent antihistamine intake.  Get bloodwork.  Continue environmental control measures.   The zyrtec prescribed as above should also help with these symptoms.

## 2021-04-21 LAB — ALPHA-GAL PANEL
Allergen Lamb IgE: <0.1 kU/L
Beef IgE: <0.1 kU/L
IgE (Immunoglobulin E), Serum: 163 IU/mL (ref 6–495)
O215-IgE Alpha-Gal: <0.1 kU/L
Pork IgE: <0.1 kU/L

## 2021-04-21 LAB — ALLERGENS W/TOTAL IGE AREA 2
Alternaria Alternata IgE: <0.1 kU/L
Aspergillus Fumigatus IgE: <0.1 kU/L
Bermuda Grass IgE: <0.1 kU/L
Cat Dander IgE: <0.1 kU/L
Cedar, Mountain IgE: <0.1 kU/L
Cladosporium Herbarum IgE: <0.1 kU/L
Cockroach, German IgE: 0.78 kU/L — AB
Common Silver Birch IgE: <0.1 kU/L
Cottonwood IgE: <0.1 kU/L
D Farinae IgE: 0.14 kU/L — AB
D Pteronyssinus IgE: 0.12 kU/L — AB
Dog Dander IgE: <0.1 kU/L
Elm, American IgE: <0.1 kU/L
Johnson Grass IgE: <0.1 kU/L
Maple/Box Elder IgE: <0.1 kU/L
Mouse Urine IgE: <0.1 kU/L
Oak, White IgE: <0.1 kU/L
Pecan, Hickory IgE: 1.3 kU/L — AB
Penicillium Chrysogen IgE: <0.1 kU/L
Pigweed, Rough IgE: <0.1 kU/L
Ragweed, Short IgE: <0.1 kU/L
Sheep Sorrel IgE Qn: <0.1 kU/L
Timothy Grass IgE: 0.19 kU/L — AB
White Mulberry IgE: <0.1 kU/L

## 2021-04-21 LAB — FOOD ALLERGY PROFILE
Allergen Corn, IgE: <0.1 kU/L
Clam IgE: <0.1 kU/L
Codfish IgE: <0.1 kU/L
Egg White IgE: <0.1 kU/L
Milk IgE: <0.1 kU/L
Peanut IgE: 0.13 kU/L — AB
Scallop IgE: <0.1 kU/L
Sesame Seed IgE: 0.33 kU/L — AB
Shrimp IgE: 0.27 kU/L — AB
Soybean IgE: 0.12 kU/L — AB
Walnut IgE: <0.1 kU/L
Wheat IgE: 0.14 kU/L — AB

## 2021-04-21 LAB — COMPREHENSIVE METABOLIC PANEL
ALT: 34 IU/L — ABNORMAL HIGH (ref 0–32)
AST: 26 IU/L (ref 0–40)
Albumin/Globulin Ratio: 1.7 (ref 1.2–2.2)
Albumin: 3.8 g/dL — ABNORMAL LOW (ref 3.9–5.0)
Alkaline Phosphatase: 93 IU/L (ref 42–106)
BUN/Creatinine Ratio: 12 (ref 9–23)
BUN: 11 mg/dL (ref 6–20)
Bilirubin Total: 0.2 mg/dL (ref 0.0–1.2)
CO2: 25 mmol/L (ref 20–29)
Calcium: 9.1 mg/dL (ref 8.7–10.2)
Chloride: 104 mmol/L (ref 96–106)
Creatinine, Ser: 0.91 mg/dL (ref 0.57–1.00)
Globulin, Total: 2.3 g/dL (ref 1.5–4.5)
Glucose: 117 mg/dL — ABNORMAL HIGH (ref 70–99)
Potassium: 4.3 mmol/L (ref 3.5–5.2)
Sodium: 142 mmol/L (ref 134–144)
Total Protein: 6.1 g/dL (ref 6.0–8.5)
eGFR: 93 mL/min/{1.73_m2} (ref >59)

## 2021-04-21 LAB — CBC WITH DIFFERENTIAL/PLATELET
Basophils Absolute: 0 10*3/uL (ref 0.0–0.2)
Basos: 0 %
EOS (ABSOLUTE): 0.2 10*3/uL (ref 0.0–0.4)
Eos: 2 %
Hematocrit: 42 % (ref 34.0–46.6)
Hemoglobin: 13.5 g/dL (ref 11.1–15.9)
Immature Grans (Abs): 0.1 10*3/uL (ref 0.0–0.1)
Immature Granulocytes: 1 %
Lymphocytes Absolute: 3.5 10*3/uL — ABNORMAL HIGH (ref 0.7–3.1)
Lymphs: 36 %
MCH: 27.4 pg (ref 26.6–33.0)
MCHC: 32.1 g/dL (ref 31.5–35.7)
MCV: 85 fL (ref 79–97)
Monocytes Absolute: 0.8 10*3/uL (ref 0.1–0.9)
Monocytes: 8 %
Neutrophils Absolute: 5.2 10*3/uL (ref 1.4–7.0)
Neutrophils: 53 %
Platelets: 391 10*3/uL (ref 150–450)
RBC: 4.92 x10E6/uL (ref 3.77–5.28)
RDW: 13.1 % (ref 11.7–15.4)
WBC: 9.9 10*3/uL (ref 3.4–10.8)

## 2021-04-21 LAB — C-REACTIVE PROTEIN: CRP: 10 mg/L (ref 0–10)

## 2021-04-21 LAB — C3 AND C4
Complement C3, Serum: 147 mg/dL (ref 82–167)
Complement C4, Serum: 31 mg/dL (ref 12–38)

## 2021-04-21 LAB — ANA W/REFLEX: Anti Nuclear Antibody (ANA): NEGATIVE

## 2021-04-21 LAB — SEDIMENTATION RATE: Sed Rate: 6 mm/hr (ref 0–32)

## 2021-04-21 LAB — CHRONIC URTICARIA: cu index: 2.8 (ref <10)

## 2021-04-21 LAB — TRYPTASE: Tryptase: 4.9 ug/L (ref 2.2–13.2)

## 2021-04-25 ENCOUNTER — Telehealth: Payer: Self-pay | Admitting: Allergy

## 2021-04-25 NOTE — Telephone Encounter (Signed)
Noted  

## 2021-04-25 NOTE — Telephone Encounter (Signed)
Pt called to go over labs, I did read out to patient Dr. Elmyra Ricks message.   "I reviewed the bloodwork. Blood count, kidney function, liver function, electrolytes, autoimmune screener, inflammation markers, chronic urticaria index (checks for autoantibodies that trigger mast cells), tryptase (checks for mast cell issues) and alpha gal (checks for red meat allergy) were all normal which is great.    Blood work was positive cockroach and tree pollen.  Borderline to dust mites. Borderline positive to shrimp, sesame seed, wheat, peanut and soy - see if hives worsen after eating these foods. They may be irrelevant sensitization.    Continue with zyrtec 10mg  twice a day and famotidine 20mg  twice a day. Keep January follow up."  Patient verbalized understanding and stated she will keep appointment for January.

## 2021-05-02 NOTE — Progress Notes (Deleted)
Follow Up Note  RE: Monica [name] MRN: [id number] DOB: 2000-12-15 Date of Office Visit: 05/03/2021  Referring provider: No ref. provider found Primary care provider: System, Provider Not In  Chief Complaint: No chief complaint on file.  History of Present Illness: I had the pleasure of seeing Monica [name] for a follow up visit at the [facility] on 05/02/2021. She is a 21 y.o. female, who is being followed for urticaria, asthma and allergic rhinitis. Her previous allergy office visit was on 04/05/2021 with Dr. Maudie Mercury. Today is a regular follow up visit.  "I reviewed the bloodwork. Blood count, kidney function, liver function, electrolytes, autoimmune screener, inflammation markers, chronic urticaria index (checks for autoantibodies that trigger mast cells), tryptase (checks for mast cell issues) and alpha gal (checks for red meat allergy) were all normal which is great.    Blood work was positive cockroach and tree pollen.  Borderline to dust mites. Borderline positive to shrimp, sesame seed, wheat, peanut and soy - see if hives worsen after eating these foods. They may be irrelevant sensitization.    Continue with zyrtec 10mg  twice a day and famotidine 20mg  twice a day. Keep January follow up."    Urticaria Almost daily urticaria for the last 3 months.  Denies any changes in medications, diet or personal care products.  Currently using loratadine 10 mg daily and prednisone.  Multiple ER visits with EpiPen use with some benefit.  No specific triggers noted. Associated asthma flares.  Unable to skin test today due to recent antihistamine intake. Keep track of episodes and take pictures of the rash.  Finish prednisone as prescribed.  Start zyrtec (cetirizine) 10mg  twice a day. If symptoms are not controlled or causes drowsiness let us know. Start Pepcid (famotidine) 20mg  twice a day.  Avoid the following potential triggers: alcohol, tight clothing, NSAIDs, hot  showers and getting overheated. Get bloodwork as below. For mild symptoms you can take over the counter antihistamines such as Benadryl and monitor symptoms closely. If symptoms worsen or if you have severe symptoms including breathing issues, throat closure, significant swelling, whole body hives, severe diarrhea and vomiting, lightheadedness then inject epinephrine and seek immediate medical care afterwards. Action plan given.  If above regimen is not effective, will start omalizumab next.    Asthma, not well controlled Diagnosed with asthma at age 23 and currently using albuterol on a daily basis with some benefit.  No recent ICS inhaler.  Smokes 3 cigarettes/day.  No prior COVID infection.  Denies reflux. Today's spirometry showed some mild obstruction with 3% improvement in FEV1 post bronchodilator treatment.  Clinically feeling unchanged. Daily controller medication(s): START Advair 115 mcg 2 puffs twice a day with spacer and rinse mouth afterwards. During upper respiratory infections/asthma flares:  If you need to use your albuterol nebulizer machine back to back within 15-30 minutes with no relief then please go to the ER/urgent care for further evaluation.  May use albuterol rescue inhaler 2 puffs or nebulizer every 4 to 6 hours as needed for shortness of breath, chest tightness, coughing, and wheezing. May use albuterol rescue inhaler 2 puffs 5 to 15 minutes prior to strenuous physical activities. Monitor frequency of use.  Get spirometry at next visit.   Other allergic rhinitis 2017 skin testing positive to trees, ragweed, weed, mold, cat, dog, cockroach, dust mites. Unable to skin test today due to recent antihistamine intake. Get bloodwork. Continue environmental control measures.  The zyrtec prescribed as above should also  help with these symptoms.   Return in about 4 weeks (around 05/03/2021).    Assessment and Plan: Monica [name] is a 21 y.o. female with: No problem-specific  Assessment & Plan notes found for this encounter.  No follow-ups on file.  No orders of the defined types were placed in this encounter.  Lab Orders  No laboratory test(s) ordered today    Diagnostics: Spirometry:  Tracings reviewed. Her effort: {Blank single:19197::"Good reproducible efforts.","It was hard to get consistent efforts and there is a question as to whether this reflects a maximal maneuver.","Poor effort, data can not be interpreted."} FVC: ***L FEV1: ***L, ***% predicted FEV1/FVC ratio: ***% Interpretation: {Blank single:19197::"Spirometry consistent with mild obstructive disease","Spirometry consistent with moderate obstructive disease","Spirometry consistent with severe obstructive disease","Spirometry consistent with possible restrictive disease","Spirometry consistent with mixed obstructive and restrictive disease","Spirometry uninterpretable due to technique","Spirometry consistent with normal pattern","No overt abnormalities noted given today's efforts"}.  Please see scanned spirometry results for details.  Skin Testing: {Blank single:19197::"Select foods","Environmental allergy panel","Environmental allergy panel and select foods","Food allergy panel","None","Deferred due to recent antihistamines use"}. *** Results discussed with patient/family.   Medication List:  Current Outpatient Medications  Medication Sig Dispense Refill   albuterol (PROVENTIL) (2.5 MG/3ML) 0.083% nebulizer solution Take 3 mLs (2.5 mg total) by nebulization every 4 (four) hours as needed for wheezing or shortness of breath (coughing fits). 75 mL 1   albuterol (VENTOLIN HFA) 108 (90 Base) MCG/ACT inhaler Inhale 2 puffs into the lungs every 4 (four) hours as needed for wheezing or shortness of breath (coughing fits). 18 g 1   cetirizine (ZYRTEC ALLERGY) 10 MG tablet Take 1 tablet (10 mg total) by mouth 2 (two) times daily. 60 tablet 2   EPINEPHrine 0.3 mg/0.3 mL IJ SOAJ injection Inject 0.3  mg into the muscle as needed for anaphylaxis. 1 each 0   famotidine (PEPCID) 20 MG tablet Take 1 tablet (20 mg total) by mouth 2 (two) times daily. 60 tablet 2   fluticasone (FLONASE) 50 MCG/ACT nasal spray Place 1 spray into both nostrils 2 (two) times daily.     fluticasone-salmeterol (ADVAIR HFA) 115-21 MCG/ACT inhaler Inhale 2 puffs into the lungs 2 (two) times daily. with spacer and rinse mouth afterwards. 1 each 5   pimecrolimus (ELIDEL) 1 % cream Apply 1 application topically as needed (eczema).     predniSONE (STERAPRED UNI-PAK 21 TAB) 10 MG (21) TBPK tablet Take by mouth daily. Take 6 tabs by mouth daily  for 1 day, then 5 tabs for 1 day, then 4 tabs for 1 day, then 3 tabs for 1 day, 2 tabs for 1 day, then 1 tab by mouth daily for 1 day 21 tablet 0   triamcinolone cream (KENALOG) 0.1 % SMARTSIG:Sparingly Topical Twice Daily     No current facility-administered medications for this visit.   Allergies: No Known Allergies I reviewed her past medical history, social history, family history, and environmental history and no significant changes have been reported from her previous visit.  Review of Systems  Constitutional:  Negative for appetite change, chills, fever and unexpected weight change.  HENT:  Negative for congestion and rhinorrhea.   Eyes:  Negative for itching.  Respiratory:  Positive for cough, chest tightness, shortness of breath and wheezing.   Cardiovascular:  Negative for chest pain.  Gastrointestinal:  Positive for diarrhea. Negative for abdominal pain.  Genitourinary:  Negative for difficulty urinating.  Skin:  Positive for rash.  Allergic/Immunologic: Positive for environmental allergies.  Neurological:  Negative for headaches.  Objective: There were no vitals taken for this visit. There is no height or weight on file to calculate BMI. Physical Exam Vitals and nursing note reviewed.  Constitutional:      Appearance: Normal appearance. She is  well-developed.  HENT:     Head: Normocephalic and atraumatic.     Right Ear: Tympanic membrane and external ear normal.     Left Ear: Tympanic membrane and external ear normal.     Nose: Nose normal.     Mouth/Throat:     Mouth: Mucous membranes are moist.     Pharynx: Oropharynx is clear.  Eyes:     Conjunctiva/sclera: Conjunctivae normal.  Cardiovascular:     Rate and Rhythm: Normal rate and regular rhythm.     Heart sounds: Normal heart sounds. No murmur heard.   No friction rub. No gallop.  Pulmonary:     Effort: Pulmonary effort is normal.     Breath sounds: Normal breath sounds. No wheezing, rhonchi or rales.  Musculoskeletal:     Cervical back: Neck supple.  Skin:    General: Skin is warm.     Findings: Rash present.     Comments: Scattered small circular hives on the legs and arms b/l.   Neurological:     Mental Status: She is alert and oriented to person, place, and time.  Psychiatric:        Behavior: Behavior normal.  Previous notes and tests were reviewed. The plan was reviewed with the patient/family, and all questions/concerned were addressed.  It was my pleasure to see Monica [name] today and participate in her care. Please feel free to contact me with any questions or concerns.  Sincerely,  Rexene Alberts, DO Allergy & Immunology  [facility] office: [place] office: [phone]

## 2021-05-03 ENCOUNTER — Ambulatory Visit: Payer: Medicaid Other | Admitting: Allergy

## 2021-05-03 DIAGNOSIS — L509 Urticaria, unspecified: Secondary | ICD-10-CM

## 2021-05-03 DIAGNOSIS — J302 Other seasonal allergic rhinitis: Secondary | ICD-10-CM

## 2021-10-05 DIAGNOSIS — F1290 Cannabis use, unspecified, uncomplicated: Secondary | ICD-10-CM | POA: Insufficient documentation

## 2021-10-25 ENCOUNTER — Other Ambulatory Visit: Payer: Self-pay | Admitting: Allergy

## 2022-01-28 ENCOUNTER — Other Ambulatory Visit: Payer: Self-pay

## 2022-01-28 ENCOUNTER — Encounter (HOSPITAL_COMMUNITY): Payer: Self-pay

## 2022-01-28 ENCOUNTER — Emergency Department (HOSPITAL_COMMUNITY)
Admission: EM | Admit: 2022-01-28 | Discharge: 2022-01-28 | Disposition: A | Discharge status: Home / Self Care | Payer: Medicaid Other | Attending: Emergency Medicine | Admitting: Emergency Medicine

## 2022-01-28 DIAGNOSIS — R059 Cough, unspecified: Secondary | ICD-10-CM | POA: Diagnosis present

## 2022-01-28 DIAGNOSIS — Z1152 Encounter for screening for COVID-19: Secondary | ICD-10-CM | POA: Insufficient documentation

## 2022-01-28 DIAGNOSIS — J069 Acute upper respiratory infection, unspecified: Secondary | ICD-10-CM | POA: Diagnosis not present

## 2022-01-28 LAB — RESP PANEL BY RT-PCR (FLU A&B, COVID) ARPGX2
Influenza A by PCR: NEGATIVE
Influenza B by PCR: NEGATIVE
SARS Coronavirus 2 by RT PCR: NEGATIVE

## 2022-01-28 NOTE — Discharge Instructions (Addendum)
Keep using your over-the-counter medications.  Follow-up on your COVID and flu test online.  Regardless of whether or not they are positive this is treated with over-the-counter remedies.  Return with any worsening symptoms, otherwise you may follow-up with an urgent care or health department as needed.  It was a pleasure to meet you and I hope you feel better.  Your work note is attached.

## 2022-01-28 NOTE — ED Provider Notes (Signed)
[facility]-EMERGENCY DEPT Provider Note   CSN: [id number] Arrival date & time: 01/28/22  1841     History  Chief Complaint  Patient presents with   Nasal Congestion    Monica [name] is a 21 y.o. female presenting with 4 days of URI symptoms.  Reports her mother came back from a cruise sick and now she is experiencing subjective fevers, chills, body aches, cough and headaches.  Has been using Mucinex and cold and flu medication over-the-counter which has been helping her.  HPI     Home Medications Prior to Admission medications   Medication Sig Start Date End Date Taking? Authorizing Provider  albuterol (PROVENTIL) (2.5 MG/3ML) 0.083% nebulizer solution Take 3 mLs (2.5 mg total) by nebulization every 4 (four) hours as needed for wheezing or shortness of breath (coughing fits). 04/05/21   Ellamae Sia, DO  cetirizine (ZYRTEC ALLERGY) 10 MG tablet Take 1 tablet (10 mg total) by mouth 2 (two) times daily. 04/05/21   Ellamae Sia, DO  EPINEPHrine 0.3 mg/0.3 mL IJ SOAJ injection Inject 0.3 mg into the muscle as needed for anaphylaxis. 03/18/21   Milagros Loll, MD  famotidine (PEPCID) 20 MG tablet Take 1 tablet (20 mg total) by mouth 2 (two) times daily. 04/05/21   Ellamae Sia, DO  fluticasone (FLONASE) 50 MCG/ACT nasal spray Place 1 spray into both nostrils 2 (two) times daily. 03/15/21   [provider]  fluticasone-salmeterol (ADVAIR HFA) 115-21 MCG/ACT inhaler Inhale 2 puffs into the lungs 2 (two) times daily. with spacer and rinse mouth afterwards. 04/05/21   Ellamae Sia, DO  pimecrolimus (ELIDEL) 1 % cream Apply 1 application topically as needed (eczema).    [provider]  predniSONE (STERAPRED UNI-PAK 21 TAB) 10 MG (21) TBPK tablet Take by mouth daily. Take 6 tabs by mouth daily  for 1 day, then 5 tabs for 1 day, then 4 tabs for 1 day, then 3 tabs for 1 day, 2 tabs for 1 day, then 1 tab by mouth daily for 1 day 03/18/21   Milagros Loll,  MD  triamcinolone cream (KENALOG) 0.1 % SMARTSIG:Sparingly Topical Twice Daily 02/17/21   [provider]  VENTOLIN HFA 108 (90 Base) MCG/ACT inhaler INHALE 2 PUFFS INTO THE LUNGS EVERY 4 (FOUR) HOURS AS NEEDED FOR WHEEZING OR SHORTNESS OF BREATH (COUGHING FITS). 10/25/21   Ellamae Sia, DO      Allergies    Patient has no known allergies.    Review of Systems   Review of Systems  Constitutional:  Positive for chills and fatigue.  Respiratory:  Positive for cough. Negative for chest tightness and shortness of breath.   Cardiovascular:  Negative for chest pain, palpitations and leg swelling.  Musculoskeletal:  Positive for myalgias.    Physical Exam Updated Vital Signs BP (!) 134/118 (BP Location: Left Arm)   Pulse 100   Temp (!) 97.4 F (36.3 C) (Oral)   Resp 15   SpO2 100%  Physical Exam Vitals and nursing note reviewed.  Constitutional:      General: She is not in acute distress.    Appearance: Normal appearance. She is not ill-appearing.  HENT:     Head: Normocephalic and atraumatic.     Right Ear: Tympanic membrane normal.     Left Ear: Tympanic membrane normal.     Mouth/Throat:     Mouth: Mucous membranes are moist.     Pharynx: Oropharynx is clear.  Eyes:  General: No scleral icterus.    Conjunctiva/sclera: Conjunctivae normal.  Cardiovascular:     Rate and Rhythm: Normal rate and regular rhythm.  Pulmonary:     Effort: Pulmonary effort is normal. No respiratory distress.     Breath sounds: No wheezing.  Skin:    General: Skin is warm and dry.     Findings: No rash.  Neurological:     Mental Status: She is alert.  Psychiatric:        Mood and Affect: Mood normal.     ED Results / Procedures / Treatments   Labs (all labs ordered are listed, but only abnormal results are displayed) Labs Reviewed  SARS CORONAVIRUS 2 BY RT PCR    EKG None  Radiology No results found.  Procedures Procedures   Medications Ordered in ED Medications - No  data to display  ED Course/ Medical Decision Making/ A&P                           Medical Decision Making  21 year old female presenting with URI symptoms.  Have been going on for 4 days.  Mother is also sick.  Physical exam: Benign.  Vital signs stable  Work-up: COVID and flu test ordered.  MDM/disposition: 21 year old female with viral symptoms.  Hemodynamically stable.  No signs of emergent condition.  Lung sounds clear, do not suspect pneumonia.  No chest x-ray ordered at this time.  She is agreeable to discharge from triage and follow-up on her results online.  All of the over-the-counter medications that she has tried have helped her so I do not believe anything else is indicated at this time.  Discharged   Final Clinical Impression(s) / ED Diagnoses Final diagnoses:  Viral upper respiratory tract infection    Rx / DC Orders ED Discharge Orders     None      Results and diagnoses were explained to the patient. Return precautions discussed in full. Patient had no additional questions and expressed complete understanding.   This chart was dictated using voice recognition software.  Despite best efforts to proofread,  errors can occur which can change the documentation meaning.    Rhae Hammock, PA-C 01/28/22 1915    Audley Hose, MD 01/28/22 [phone]

## 2022-01-28 NOTE — ED Triage Notes (Signed)
Patient said she thinks she got a virus from her mom. She has nasal congestion, cough and "cold spurts."

## 2022-04-25 NOTE — Progress Notes (Deleted)
Follow Up Note  RE: Monica [name] MRN: [id number] DOB: 03-15-2001 Date of Office Visit: 04/26/2022  Referring provider: No ref. provider found Primary care provider: System, Provider Not In  Chief Complaint: No chief complaint on file.  History of Present Illness: I had the pleasure of seeing Monica [name] for a follow up visit at the [facility] on 04/25/2022. She is a 22 y.o. female, who is being followed for urticaria, asthma and allergic rhinitis. Her previous allergy office visit was on 02/03/2021 with Dr. Maudie Mercury. Today is a new complaint visit of allergy flareup .  Urticaria Almost daily urticaria for the last 3 months.  Denies any changes in medications, diet or personal care products.  Currently using loratadine 10 mg daily and prednisone.  Multiple ER visits with EpiPen use with some benefit.  No specific triggers noted. Associated asthma flares.  Unable to skin test today due to recent antihistamine intake. Keep track of episodes and take pictures of the rash.  Finish prednisone as prescribed.  Start zyrtec (cetirizine) 39m twice a day. If symptoms are not controlled or causes drowsiness let [place]. Start Pepcid (famotidine) 274mtwice a day.  Avoid the following potential triggers: alcohol, tight clothing, NSAIDs, hot showers and getting overheated. Get bloodwork as below. For mild symptoms you can take over the counter antihistamines such as Benadryl and monitor symptoms closely. If symptoms worsen or if you have severe symptoms including breathing issues, throat closure, significant swelling, whole body hives, severe diarrhea and vomiting, lightheadedness then inject epinephrine and seek immediate medical care afterwards. Action plan given.  If above regimen is not effective, will start omalizumab next.    Asthma, not well controlled Diagnosed with asthma at age [age]nd currently using albuterol on a daily basis with some benefit.  No recent ICS inhaler.   Smokes 3 cigarettes/day.  No prior COVID infection.  Denies reflux. Today's spirometry showed some mild obstruction with 3% improvement in FEV1 post bronchodilator treatment.  Clinically feeling unchanged. Daily controller medication(s): START Advair 115 mcg 2 puffs twice a day with spacer and rinse mouth afterwards. During upper respiratory infections/asthma flares:  If you need to use your albuterol nebulizer machine back to back within 15-30 minutes with no relief then please go to the ER/urgent care for further evaluation.  May use albuterol rescue inhaler 2 puffs or nebulizer every 4 to 6 hours as needed for shortness of breath, chest tightness, coughing, and wheezing. May use albuterol rescue inhaler 2 puffs 5 to 15 minutes prior to strenuous physical activities. Monitor frequency of use.  Get spirometry at next visit.   Other allergic rhinitis 2017 skin testing positive to trees, ragweed, weed, mold, cat, dog, cockroach, dust mites. Unable to skin test today due to recent antihistamine intake. Get bloodwork. Continue environmental control measures.  The zyrtec prescribed as above should also help with these symptoms.   Return in about 4 weeks (around 05/03/2021).   "I reviewed the bloodwork. Blood count, kidney function, liver function, electrolytes, autoimmune screener, inflammation markers, chronic urticaria index (checks for autoantibodies that trigger mast cells), tryptase (checks for mast cell issues) and alpha gal (checks for red meat allergy) were all normal which is great.    Blood work was positive cockroach and tree pollen.  Borderline to dust mites. Borderline positive to shrimp, sesame seed, wheat, peanut and soy - see if hives worsen after eating these foods. They may be irrelevant sensitization.    Continue with  zyrtec 24m twice a day and famotidine 259mtwice a day. Keep January follow up."    Assessment and Plan: [name] a [age].o. female with: No  problem-specific Assessment & Plan notes found for this encounter.  No follow-ups on file.  No orders of the defined types were placed in this encounter.  Lab Orders  No laboratory test(s) ordered today    Diagnostics: Spirometry:  Tracings reviewed. Her effort: {Blank single:19197::"Good reproducible efforts.","It was hard to get consistent efforts and there is a question as to whether this reflects a maximal maneuver.","Poor effort, data can not be interpreted."} FVC: ***L FEV1: ***L, ***% predicted FEV1/FVC ratio: ***% Interpretation: {Blank single:19197::"Spirometry consistent with mild obstructive disease","Spirometry consistent with moderate obstructive disease","Spirometry consistent with severe obstructive disease","Spirometry consistent with possible restrictive disease","Spirometry consistent with mixed obstructive and restrictive disease","Spirometry uninterpretable due to technique","Spirometry consistent with normal pattern","No overt abnormalities noted given today's efforts"}.  Please see scanned spirometry results for details.  Skin Testing: {Blank single:19197::"Select foods","Environmental allergy panel","Environmental allergy panel and select foods","Food allergy panel","None","Deferred due to recent antihistamines use"}. *** Results discussed with patient/family.   Medication List:  Current Outpatient Medications  Medication Sig Dispense Refill   albuterol (PROVENTIL) (2.5 MG/3ML) 0.083% nebulizer solution Take 3 mLs (2.5 mg total) by nebulization every 4 (four) hours as needed for wheezing or shortness of breath (coughing fits). 75 mL 1   cetirizine (ZYRTEC ALLERGY) 10 MG tablet Take 1 tablet (10 mg total) by mouth 2 (two) times daily. 60 tablet 2   EPINEPHrine 0.3 mg/0.3 mL IJ SOAJ injection Inject 0.3 mg into the muscle as needed for anaphylaxis. 1 each 0   famotidine (PEPCID) 20 MG tablet Take 1 tablet (20 mg total) by mouth 2 (two) times daily. 60 tablet 2    fluticasone (FLONASE) 50 MCG/ACT nasal spray Place 1 spray into both nostrils 2 (two) times daily.     fluticasone-salmeterol (ADVAIR HFA) 115-21 MCG/ACT inhaler Inhale 2 puffs into the lungs 2 (two) times daily. with spacer and rinse mouth afterwards. 1 each 5   pimecrolimus (ELIDEL) 1 % cream Apply 1 application topically as needed (eczema).     predniSONE (STERAPRED UNI-PAK 21 TAB) 10 MG (21) TBPK tablet Take by mouth daily. Take 6 tabs by mouth daily  for 1 day, then 5 tabs for 1 day, then 4 tabs for 1 day, then 3 tabs for 1 day, 2 tabs for 1 day, then 1 tab by mouth daily for 1 day 21 tablet 0   triamcinolone cream (KENALOG) 0.1 % SMARTSIG:Sparingly Topical Twice Daily     VENTOLIN HFA 108 (90 Base) MCG/ACT inhaler INHALE 2 PUFFS INTO THE LUNGS EVERY 4 (FOUR) HOURS AS NEEDED FOR WHEEZING OR SHORTNESS OF BREATH (COUGHING FITS). 18 each 1   No current facility-administered medications for this visit.   Allergies: No Known Allergies I reviewed her past medical history, social history, family history, and environmental history and no significant changes have been reported from her previous visit.  Review of Systems  Constitutional:  Negative for appetite change, chills, fever and unexpected weight change.  HENT:  Negative for congestion and rhinorrhea.   Eyes:  Negative for itching.  Respiratory:  Positive for cough, chest tightness, shortness of breath and wheezing.   Cardiovascular:  Negative for chest pain.  Gastrointestinal:  Positive for diarrhea. Negative for abdominal pain.  Genitourinary:  Negative for difficulty urinating.  Skin:  Positive for rash.  Allergic/Immunologic: Positive for environmental allergies.  Neurological:  Negative for  headaches.    Objective: There were no vitals taken for this visit. There is no height or weight on file to calculate BMI. Physical Exam Vitals and nursing note reviewed.  Constitutional:      Appearance: Normal appearance. She is  well-developed.  HENT:     Head: Normocephalic and atraumatic.     Right Ear: Tympanic membrane and external ear normal.     Left Ear: Tympanic membrane and external ear normal.     Nose: Nose normal.     Mouth/Throat:     Mouth: Mucous membranes are moist.     Pharynx: Oropharynx is clear.  Eyes:     Conjunctiva/sclera: Conjunctivae normal.  Cardiovascular:     Rate and Rhythm: Normal rate and regular rhythm.     Heart sounds: Normal heart sounds. No murmur heard.    No friction rub. No gallop.  Pulmonary:     Effort: Pulmonary effort is normal.     Breath sounds: Normal breath sounds. No wheezing, rhonchi or rales.  Musculoskeletal:     Cervical back: Neck supple.  Skin:    General: Skin is warm.     Findings: Rash present.     Comments: Scattered small circular hives on the legs and arms b/l.   Neurological:     Mental Status: She is alert and oriented to person, place, and time.  Psychiatric:        Behavior: Behavior normal.    Previous notes and tests were reviewed. The plan was reviewed with the patient/family, and all questions/concerned were addressed.  It was my pleasure to see [place] today and participate in her care. Please feel free to contact me with any questions or concerns.  Sincerely,  Rexene Alberts, DO Allergy & Immunology  [facility] office: [place] office: [phone]

## 2022-04-26 ENCOUNTER — Ambulatory Visit: Payer: Medicaid Other | Admitting: Allergy

## 2022-04-26 DIAGNOSIS — L509 Urticaria, unspecified: Secondary | ICD-10-CM

## 2022-04-26 DIAGNOSIS — J302 Other seasonal allergic rhinitis: Secondary | ICD-10-CM

## 2022-05-15 ENCOUNTER — Emergency Department (HOSPITAL_BASED_OUTPATIENT_CLINIC_OR_DEPARTMENT_OTHER): Payer: Medicaid Other | Admitting: Radiology

## 2022-05-15 ENCOUNTER — Emergency Department (HOSPITAL_BASED_OUTPATIENT_CLINIC_OR_DEPARTMENT_OTHER)
Admission: EM | Admit: 2022-05-15 | Discharge: 2022-05-15 | Disposition: A | Discharge status: Home / Self Care | Payer: Medicaid Other | Attending: Emergency Medicine | Admitting: Emergency Medicine

## 2022-05-15 ENCOUNTER — Other Ambulatory Visit: Payer: Self-pay

## 2022-05-15 DIAGNOSIS — M542 Cervicalgia: Secondary | ICD-10-CM | POA: Insufficient documentation

## 2022-05-15 DIAGNOSIS — M545 Low back pain, unspecified: Secondary | ICD-10-CM | POA: Diagnosis not present

## 2022-05-15 DIAGNOSIS — M25511 Pain in right shoulder: Secondary | ICD-10-CM | POA: Insufficient documentation

## 2022-05-15 DIAGNOSIS — Y92410 Unspecified street and highway as the place of occurrence of the external cause: Secondary | ICD-10-CM | POA: Insufficient documentation

## 2022-05-15 MED ORDER — IBUPROFEN 600 MG PO TABS: 600.0000 mg | ORAL_TABLET | Freq: Four times a day (QID) | ORAL | 0 refills | Status: DC | PRN

## 2022-05-15 NOTE — Discharge Instructions (Addendum)
Note the workup today was overall reassuring.  As discussed, x-rays were negative for any acute abnormalities.  Recommend symptomatic therapy at home with Tylenol/Motrin as needed for pain.  Recommend follow-up with primary care for reassessment of your symptoms.  Please not hesitate to return to emergency department for worrisome signs and symptoms we discussed become apparent.
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
## 2022-05-15 NOTE — ED Notes (Signed)
Patient verbalizes understanding of discharge instructions. Opportunity for questioning and answers were provided. Patient discharged from ED.  °
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
## 2022-09-12 ENCOUNTER — Inpatient Hospital Stay (HOSPITAL_COMMUNITY)
Admission: EM | Admit: 2022-09-12 | Discharge: 2022-09-14 | DRG: 103 | Disposition: A | Discharge status: Home / Self Care | Payer: Medicaid Other | Attending: Internal Medicine | Admitting: Internal Medicine

## 2022-09-12 ENCOUNTER — Emergency Department (HOSPITAL_COMMUNITY): Payer: Medicaid Other

## 2022-09-12 ENCOUNTER — Emergency Department (HOSPITAL_COMMUNITY)
Admission: EM | Admit: 2022-09-12 | Discharge: 2022-09-12 | Disposition: A | Discharge status: Home / Self Care | Payer: Medicaid Other | Source: Home / Self Care | Attending: Emergency Medicine | Admitting: Emergency Medicine

## 2022-09-12 ENCOUNTER — Encounter (HOSPITAL_COMMUNITY): Payer: Self-pay

## 2022-09-12 ENCOUNTER — Other Ambulatory Visit: Payer: Self-pay

## 2022-09-12 DIAGNOSIS — H539 Unspecified visual disturbance: Secondary | ICD-10-CM | POA: Insufficient documentation

## 2022-09-12 DIAGNOSIS — G932 Benign intracranial hypertension: Principal | ICD-10-CM | POA: Insufficient documentation

## 2022-09-12 DIAGNOSIS — H5340 Unspecified visual field defects: Secondary | ICD-10-CM | POA: Diagnosis present

## 2022-09-12 DIAGNOSIS — J45909 Unspecified asthma, uncomplicated: Secondary | ICD-10-CM | POA: Diagnosis present

## 2022-09-12 DIAGNOSIS — E669 Obesity, unspecified: Secondary | ICD-10-CM | POA: Diagnosis present

## 2022-09-12 DIAGNOSIS — Z6841 Body Mass Index (BMI) 40.0 and over, adult: Secondary | ICD-10-CM

## 2022-09-12 DIAGNOSIS — E876 Hypokalemia: Secondary | ICD-10-CM | POA: Diagnosis present

## 2022-09-12 DIAGNOSIS — Z79899 Other long term (current) drug therapy: Secondary | ICD-10-CM

## 2022-09-12 DIAGNOSIS — H4710 Unspecified papilledema: Secondary | ICD-10-CM

## 2022-09-12 DIAGNOSIS — H5461 Unqualified visual loss, right eye, normal vision left eye: Principal | ICD-10-CM | POA: Diagnosis present

## 2022-09-12 DIAGNOSIS — H547 Unspecified visual loss: Secondary | ICD-10-CM

## 2022-09-12 DIAGNOSIS — Z825 Family history of asthma and other chronic lower respiratory diseases: Secondary | ICD-10-CM

## 2022-09-12 DIAGNOSIS — Z7951 Long term (current) use of inhaled steroids: Secondary | ICD-10-CM

## 2022-09-12 LAB — CBC
HCT: 36.9 % (ref 36.0–46.0)
Hemoglobin: 12.5 g/dL (ref 12.0–15.0)
MCH: 28.7 pg (ref 26.0–34.0)
MCHC: 33.9 g/dL (ref 30.0–36.0)
MCV: 84.6 fL (ref 80.0–100.0)
Platelets: 326 10*3/uL (ref 150–400)
RBC: 4.36 MIL/uL (ref 3.87–5.11)
RDW: 13.9 % (ref 11.5–15.5)
WBC: 10.4 10*3/uL (ref 4.0–10.5)
nRBC: 0 % (ref 0.0–0.2)

## 2022-09-12 LAB — BASIC METABOLIC PANEL
Anion gap: 7 (ref 5–15)
BUN: 7 mg/dL (ref 6–20)
CO2: 22 mmol/L (ref 22–32)
Calcium: 8.6 mg/dL — ABNORMAL LOW (ref 8.9–10.3)
Chloride: 106 mmol/L (ref 98–111)
Creatinine, Ser: 0.75 mg/dL (ref 0.44–1.00)
GFR, Estimated: >60 mL/min (ref >60)
Glucose, Bld: 109 mg/dL — ABNORMAL HIGH (ref 70–99)
Potassium: 3.3 mmol/L — ABNORMAL LOW (ref 3.5–5.1)
Sodium: 135 mmol/L (ref 135–145)

## 2022-09-12 MED ORDER — ACETAMINOPHEN 325 MG PO TABS: 650.0000 mg | ORAL_TABLET | Freq: Four times a day (QID) | ORAL | Status: DC | PRN

## 2022-09-12 MED ORDER — KETOROLAC TROMETHAMINE 15 MG/ML IJ SOLN
15.0000 mg | Freq: Once | INTRAMUSCULAR | Status: AC
  Administered 2022-09-12: 15 mg via INTRAVENOUS
  Filled 2022-09-12: qty 1

## 2022-09-12 MED ORDER — POTASSIUM CHLORIDE CRYS ER 20 MEQ PO TBCR
40.0000 meq | EXTENDED_RELEASE_TABLET | Freq: Once | ORAL | Status: AC
  Administered 2022-09-12: 40 meq via ORAL
  Filled 2022-09-12: qty 2

## 2022-09-12 MED ORDER — GADOBUTROL 1 MMOL/ML IV SOLN
10.0000 mL | Freq: Once | INTRAVENOUS | Status: AC | PRN
  Administered 2022-09-12: 10 mL via INTRAVENOUS

## 2022-09-12 MED ORDER — ONDANSETRON HCL 4 MG/2ML IJ SOLN: 4.0000 mg | Freq: Four times a day (QID) | INTRAMUSCULAR | Status: DC | PRN

## 2022-09-12 MED ORDER — ONDANSETRON HCL 4 MG PO TABS: 4.0000 mg | ORAL_TABLET | Freq: Four times a day (QID) | ORAL | Status: DC | PRN

## 2022-09-12 MED ORDER — FLUORESCEIN SODIUM 1 MG OP STRP
1.0000 | ORAL_STRIP | Freq: Once | OPHTHALMIC | Status: AC
  Administered 2022-09-12: 1 via OPHTHALMIC
  Filled 2022-09-12: qty 1

## 2022-09-12 MED ORDER — TETRACAINE HCL 0.5 % OP SOLN
2.0000 [drp] | Freq: Once | OPHTHALMIC | Status: AC
  Administered 2022-09-12: 2 [drp] via OPHTHALMIC
  Filled 2022-09-12: qty 4

## 2022-09-12 MED ORDER — ACETAMINOPHEN 650 MG RE SUPP: 650.0000 mg | Freq: Four times a day (QID) | RECTAL | Status: DC | PRN

## 2022-09-12 MED ORDER — ACETAZOLAMIDE 250 MG PO TABS
250.0000 mg | ORAL_TABLET | Freq: Two times a day (BID) | ORAL | Status: DC
  Administered 2022-09-12 – 2022-09-14 (×4): 250 mg via ORAL
  Filled 2022-09-12 (×6): qty 1

## 2022-09-12 NOTE — Assessment & Plan Note (Addendum)
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 MR brain and MR orbits w and w/o is negative. Suspicion at this point = [facility] EDP unable to get LP to confirm diagnosis Admitting for IR LP in AM D/w Dr. Wilford Corner with neurology: Suspicion high enough to start empiric diamox 250mg  BID at this point See his plan of care note for further details.

## 2022-09-12 NOTE — ED Triage Notes (Signed)
Patient reports 4 days ago her vision changed in right eye while looking at phone, has stayed that way since and went to eye dr yesterday, he told her to come to ER, states she can see some color but further away is all black. Also states she has been having migraines and unsure if vision changes related to this. Denies migraine currently.

## 2022-09-12 NOTE — ED Triage Notes (Signed)
Pt with darker vision than normal on Friday, in her right eye only. Seen at [facility] for same four days ago and told to follow up with eye doctor. Went to that appointment and was called back and told to come to the ED for a LP, MRI, and other testing.

## 2022-09-12 NOTE — ED Notes (Signed)
Pt moved to room #21.

## 2022-09-12 NOTE — ED Notes (Signed)
Pt transported to CT ?

## 2022-09-12 NOTE — ED Notes (Signed)
Visual acuity  R 20/200 L 20/25 Both 20/30

## 2022-09-12 NOTE — ED Notes (Signed)
Patient transported to MRI 

## 2022-09-12 NOTE — ED Provider Notes (Addendum)
Received call from Dr. Cathey Endow, ophthalmologist.  Patient was seen in the ED and referred for follow-up.  Patient seen in office today.  She has blurred vision in her right eye.  Had a CT that was performed here that was normal.  Today in his office she has bilateral disc edema with a visual field defect in the right eye.  He advises that patient needs MRI of brain and orbits with and without contrast and MRV, neurology consult, and probable LP.   Margarita Grizzle, MD 09/12/22 1130    Margarita Grizzle, MD 09/12/22 1131

## 2022-09-12 NOTE — ED Provider Notes (Signed)
[facility] EMERGENCY DEPARTMENT AT [facility] Provider Note   CSN: [id number] Arrival date & time: 09/12/22  0347     History  Chief Complaint  Patient presents with   Loss of Vision    Monica [name] is a 22 y.o. female.  The history is provided by the patient.  Eye Problem Location:  Right eye Duration:  4 days Timing:  Constant Progression:  Unchanged Chronicity:  New Context comment:  Awoke Relieved by:  Nothing Worsened by:  Nothing Ineffective treatments:  None tried Associated symptoms: decreased vision   Associated symptoms: no crusting, no discharge, no double vision, no facial rash, no itching, no nausea, no numbness, no redness, no swelling and no weakness   Associated symptoms comment:  Lights appear black while driving, vision is reported normal with both eyes open but painless blackness of vision in right eye with left eye closed  Risk factors: no conjunctival hemorrhage   Does not wear corrective lenses reports being seen by Dr. Nedra Hai and told to come in for labs.  Painless blackness but not to all things for 4 days.  No HA.  No weakness no numbness.  No changes in speech.       Home Medications Prior to Admission medications   Medication Sig Start Date End Date Taking? Authorizing Provider  albuterol (PROVENTIL) (2.5 MG/3ML) 0.083% nebulizer solution Take 3 mLs (2.5 mg total) by nebulization every 4 (four) hours as needed for wheezing or shortness of breath (coughing fits). 04/05/21   Ellamae Sia, DO  cetirizine (ZYRTEC ALLERGY) 10 MG tablet Take 1 tablet (10 mg total) by mouth 2 (two) times daily. 04/05/21   Ellamae Sia, DO  EPINEPHrine 0.3 mg/0.3 mL IJ SOAJ injection Inject 0.3 mg into the muscle as needed for anaphylaxis. 03/18/21   Milagros Loll, MD  famotidine (PEPCID) 20 MG tablet Take 1 tablet (20 mg total) by mouth 2 (two) times daily. 04/05/21   Ellamae Sia, DO  fluticasone (FLONASE) 50 MCG/ACT nasal spray Place 1 spray into both  nostrils 2 (two) times daily. 03/15/21   [provider]  fluticasone-salmeterol (ADVAIR HFA) 115-21 MCG/ACT inhaler Inhale 2 puffs into the lungs 2 (two) times daily. with spacer and rinse mouth afterwards. 04/05/21   Ellamae Sia, DO  ibuprofen (ADVIL) 600 MG tablet Take 1 tablet (600 mg total) by mouth every 6 (six) hours as needed. 05/15/22   Sherian Maroon A, PA  pimecrolimus (ELIDEL) 1 % cream Apply 1 application topically as needed (eczema).    [provider]  predniSONE (STERAPRED UNI-PAK 21 TAB) 10 MG (21) TBPK tablet Take by mouth daily. Take 6 tabs by mouth daily  for 1 day, then 5 tabs for 1 day, then 4 tabs for 1 day, then 3 tabs for 1 day, 2 tabs for 1 day, then 1 tab by mouth daily for 1 day 03/18/21   Milagros Loll, MD  triamcinolone cream (KENALOG) 0.1 % SMARTSIG:Sparingly Topical Twice Daily 02/17/21   [provider]  VENTOLIN HFA 108 (90 Base) MCG/ACT inhaler INHALE 2 PUFFS INTO THE LUNGS EVERY 4 (FOUR) HOURS AS NEEDED FOR WHEEZING OR SHORTNESS OF BREATH (COUGHING FITS). 10/25/21   Ellamae Sia, DO      Allergies    Patient has no known allergies.    Review of Systems   Review of Systems  Constitutional:  Negative for fever.  HENT:  Negative for facial swelling.   Eyes:  Positive  for visual disturbance. Negative for double vision, pain, discharge, redness and itching.  Respiratory:  Negative for wheezing and stridor.   Gastrointestinal:  Negative for nausea.  Neurological:  Negative for speech difficulty, weakness and numbness.  All other systems reviewed and are negative.   Physical Exam Updated Vital Signs BP (!) 141/94 (BP Location: Left Arm)   Pulse (!) 104   Temp 98.5 F (36.9 C) (Oral)   Resp 18   Ht 5\' 5"  (1.651 m)   Wt 127 kg   SpO2 100%   BMI 46.59 kg/m  Physical Exam Vitals and nursing note reviewed.  Constitutional:      General: She is not in acute distress.    Appearance: Normal appearance. She is well-developed.   HENT:     Head: Normocephalic and atraumatic.     Nose: Nose normal.     Mouth/Throat:     Mouth: Mucous membranes are moist.     Pharynx: Oropharynx is clear.  Eyes:     General: No scleral icterus.       Right eye: No discharge.        Left eye: No discharge.     Extraocular Movements: Extraocular movements intact.     Right eye: Normal extraocular motion and no nystagmus.     Left eye: Normal extraocular motion and no nystagmus.     Pupils: Pupils are equal, round, and reactive to light.     Comments: No afferent pupillary defect, no field cuts in any quadrant on exam   Cardiovascular:     Rate and Rhythm: Normal rate and regular rhythm.     Pulses: Normal pulses.     Heart sounds: Normal heart sounds.  Pulmonary:     Effort: Pulmonary effort is normal. No respiratory distress.     Breath sounds: Normal breath sounds.  Abdominal:     General: Bowel sounds are normal. There is no distension.     Palpations: Abdomen is soft.     Tenderness: There is no abdominal tenderness. There is no guarding or rebound.  Genitourinary:    Vagina: No vaginal discharge.  Musculoskeletal:        General: Normal range of motion.     Cervical back: Neck supple.  Skin:    General: Skin is dry.     Capillary Refill: Capillary refill takes less than 2 seconds.     Findings: No erythema or rash.  Neurological:     General: No focal deficit present.     Mental Status: She is alert.     Deep Tendon Reflexes: Reflexes normal.  Psychiatric:        Mood and Affect: Mood normal.       ED Results / Procedures / Treatments   Labs (all labs ordered are listed, but only abnormal results are displayed) Labs Reviewed  PREGNANCY, URINE    EKG None  Radiology CT Head Wo Contrast  Result Date: 09/12/2022 CLINICAL DATA:  Vision changes in the right eye for 4 days EXAM: CT HEAD WITHOUT CONTRAST TECHNIQUE: Contiguous axial images were obtained from the base of the skull through the vertex  without intravenous contrast. RADIATION DOSE REDUCTION: This exam was performed according to the departmental dose-optimization program which includes automated exposure control, adjustment of the mA and/or kV according to patient size and/or use of iterative reconstruction technique. COMPARISON:  None Available. FINDINGS: Brain: No evidence of acute infarction, hemorrhage, hydrocephalus, extra-axial collection or mass lesion/mass effect. Vascular: No hyperdense vessel or  unexpected calcification. Skull: Normal. Negative for fracture or focal lesion. Sinuses/Orbits: Negative IMPRESSION: Normal head CT. Electronically Signed   By: Tiburcio Pea M.D.   On: 09/12/2022 06:11    Procedures Procedures    Medications Ordered in ED Medications  fluorescein ophthalmic strip 1 strip (1 strip Right Eye Given 09/12/22 0530)  tetracaine (PONTOCAINE) 0.5 % ophthalmic solution 2 drop (2 drops Right Eye Given 09/12/22 0530)    ED Course/ Medical Decision Making/ A&P                             Medical Decision Making Patient with waxing and waning blackness ok ok with both eyes open and looking at street lights ok in the room if left eye closed is a problem.    Amount and/or Complexity of Data Reviewed External Data Reviewed: notes.    Details: Previous notes reviewed  Labs: ordered. Radiology: ordered and independent interpretation performed.    Details: Negative head CT  Discussion of management or test interpretation with external provider(s): Case d/w Dr. Charlotte Sanes of ophthalmology follow up in the office today at 815 am.    Risk Prescription drug management. Risk Details: Vision was 20/30 B, 20/25 left eye and 20/200 R eye.  Will discharge to be seen by ophthalmology.  Stable for discharge.  Strict return.      Final Clinical Impression(s) / ED Diagnoses Final diagnoses:  Vision disturbance   Return for intractable cough, coughing up blood, fevers > 100.4 unrelieved by medication, shortness of  breath, intractable vomiting, chest pain, shortness of breath, weakness, numbness, changes in speech, facial asymmetry, abdominal pain, passing out, Inability to tolerate liquids or food, cough, altered mental status or any concerns. No signs of systemic illness or infection. The patient is nontoxic-appearing on exam and vital signs are within normal limits.  I have reviewed the triage vital signs and the nursing notes. Pertinent labs & imaging results that were available during my care of the patient were reviewed by me and considered in my medical decision making (see chart for details). After history, exam, and medical workup I feel the patient has been appropriately medically screened and is safe for discharge home. Pertinent diagnoses were discussed with the patient. Patient was given return precautions.  Rx / DC Orders ED Discharge Orders     None         Hermina Barnard, MD 09/12/22 [id number]

## 2022-09-12 NOTE — H&P (Signed)
History and Physical    Patient: Monica [name]:[id number] DOB: 07/11/2000 DOA: 09/12/2022 DOS: the patient was seen and examined on 09/12/2022 PCP: [facility]  Patient coming from: Home  Chief Complaint:  Chief Complaint  Patient presents with   Eye Problem   HPI: Monica [name] is a 22 y.o. female with medical history significant of obesity.  Pt seen in ED earlier this AM with loss of vision in R eye for past 4 days.  Painless.  Pt referred to optho urgently, saw optho in office this AM.  Per optho exam: pt with bilateral papiledema and visual field deficit in R eye.  Pt sent back to ED for MRI and LP.   Review of Systems: As mentioned in the history of present illness. All other systems reviewed and are negative. Past Medical History:  Diagnosis Date   Asthma    Eczema    Urticaria 04/05/2021   Past Surgical History:  Procedure Laterality Date   ORIF ANKLE FRACTURE Right 09/03/2018   Procedure: RIGHT OPEN REDUCTION INTERNAL FIXATION (ORIF) ANKLE FRACTURE;  Surgeon: Cammy Copa, MD;  Location: [facility] OR;  Service: Orthopedics;  Laterality: Right;   WISDOM TOOTH EXTRACTION  11/2016   Social History:  reports that she has never smoked. She has never used smokeless tobacco. She reports that she does not drink alcohol and does not use drugs.  Allergies  Allergen Reactions   Soy Allergy Hives    Family History  Problem Relation Age of Onset   Asthma Mother    Asthma Father    Eczema Sister    Asthma Brother    Allergic rhinitis Neg Hx    Angioedema Neg Hx    Immunodeficiency Neg Hx    Urticaria Neg Hx     Prior to Admission medications   Medication Sig Start Date End Date Taking? Authorizing Provider  albuterol (PROVENTIL) (2.5 MG/3ML) 0.083% nebulizer solution Take 3 mLs (2.5 mg total) by nebulization every 4 (four) hours as needed for wheezing or shortness of breath (coughing fits). 04/05/21  Yes Ellamae Sia, DO   cetirizine (ZYRTEC ALLERGY) 10 MG tablet Take 1 tablet (10 mg total) by mouth 2 (two) times daily. Patient taking differently: Take 10 mg by mouth 2 (two) times daily as needed for allergies. 04/05/21  Yes Ellamae Sia, DO  ibuprofen (ADVIL) 600 MG tablet Take 1 tablet (600 mg total) by mouth every 6 (six) hours as needed. Patient taking differently: Take 600 mg by mouth every 6 (six) hours as needed for headache. 05/15/22  Yes Sherian Maroon A, PA  triamcinolone cream (KENALOG) 0.1 % Apply 1 Application topically daily as needed (for hives). 02/17/21  Yes [provider]  VENTOLIN HFA 108 (90 Base) MCG/ACT inhaler INHALE 2 PUFFS INTO THE LUNGS EVERY 4 (FOUR) HOURS AS NEEDED FOR WHEEZING OR SHORTNESS OF BREATH (COUGHING FITS). Patient taking differently: Inhale 2 puffs into the lungs every 4 (four) hours as needed for wheezing or shortness of breath. 10/25/21  Yes Ellamae Sia, DO  EPINEPHrine 0.3 mg/0.3 mL IJ SOAJ injection Inject 0.3 mg into the muscle as needed for anaphylaxis. Patient not taking: Reported on 09/12/2022 03/18/21   Milagros Loll, MD    Physical Exam: Vitals:   09/12/22 1323 09/12/22 1846 09/12/22 2110  BP: 126/71 119/70 (!) 104/51  Pulse: 94 85 99  Resp: 18 16 18   Temp: 98.6 F (37 C) 97.8 F (36.6 C)  TempSrc: Oral Oral   SpO2: 100% 100% 100%   Constitutional: NAD, calm, comfortable Respiratory: clear to auscultation bilaterally, no wheezing, no crackles. Normal respiratory effort. No accessory muscle use.  Cardiovascular: Regular rate and rhythm, no murmurs / rubs / gallops. No extremity edema. 2+ pedal pulses. No carotid bruits.  Abdomen: no tenderness, no masses palpated. No hepatosplenomegaly. Bowel sounds positive.  Neurologic: Visual field deficit in R eye Psychiatric: Normal judgment and insight. Alert and oriented x 3. Normal mood.   Data Reviewed:     Labs on Admission: I have personally reviewed following labs and imaging  studies  CBC: Recent Labs  Lab 09/12/22 1601  WBC 10.4  HGB 12.5  HCT 36.9  MCV 84.6  PLT 326   Basic Metabolic Panel: Recent Labs  Lab 09/12/22 1601  NA 135  K 3.3*  CL 106  CO2 22  GLUCOSE 109*  BUN 7  CREATININE 0.75  CALCIUM 8.6*   GFR: Estimated Creatinine Clearance: 149.3 mL/min (by C-G formula based on SCr of 0.75 mg/dL).  Urine analysis:    Component Value Date/Time   COLORURINE YELLOW 04/03/2021 2348   APPEARANCEUR CLEAR 04/03/2021 2348   LABSPEC 1.014 04/03/2021 2348   PHURINE 6.0 04/03/2021 2348   GLUCOSEU NEGATIVE 04/03/2021 2348   HGBUR NEGATIVE 04/03/2021 2348   BILIRUBINUR NEGATIVE 04/03/2021 2348   KETONESUR NEGATIVE 04/03/2021 2348   PROTEINUR NEGATIVE 04/03/2021 2348   UROBILINOGEN 0.2 01/12/2015 1500   NITRITE NEGATIVE 04/03/2021 2348   LEUKOCYTESUR NEGATIVE 04/03/2021 2348    Radiological Exams on Admission: MR Brain W and Wo Contrast  Result Date: 09/12/2022 CLINICAL DATA:  Blurry vision in the right eye, bilateral disc edema and visual field defect in the right eye. EXAM: MRI HEAD AND ORBITS WITHOUT AND WITH CONTRAST TECHNIQUE: Multiplanar, multiecho pulse sequences of the brain and surrounding structures were obtained without and with intravenous contrast. Multiplanar, multiecho pulse sequences of the orbits and surrounding structures were obtained including fat saturation techniques, before and after intravenous contrast administration. CONTRAST:  10mL GADAVIST GADOBUTROL 1 MMOL/ML IV SOLN COMPARISON:  Same-day CT head. FINDINGS: MRI HEAD FINDINGS Brain: There is no acute intracranial hemorrhage, extra-axial fluid collection, or acute infarct. Parenchymal volume is normal. The ventricles are normal in size. Parenchymal signal is normal. The pituitary and suprasellar region are normal. There is no mass lesion or abnormal enhancement. There is no mass effect or midline shift. Vascular: Normal flow voids. Skull and upper cervical spine: Normal  marrow signal. Other: None. MRI ORBITS FINDINGS Orbits: Right: The globe is normal. The orbital fat is normal. The extraocular muscles are normal. The optic nerve is normal. The lacrimal gland is normal. There is no abnormal enhancement. Left: The globe is normal. The orbital fat is normal. The extraocular muscles are normal. The optic nerve is normal. The lacrimal gland is normal. There is no abnormal enhancement. The optic chiasm is normal. Visualized sinuses: There is mild mucosal thickening in the paranasal sinuses. Soft tissues: Unremarkable. IMPRESSION: Unremarkable MRI of the brain and orbits with no acute finding. Electronically Signed   By: Lesia Hausen M.D.   On: 09/12/2022 19:58   MR ORBITS W WO CONTRAST  Result Date: 09/12/2022 CLINICAL DATA:  Blurry vision in the right eye, bilateral disc edema and visual field defect in the right eye. EXAM: MRI HEAD AND ORBITS WITHOUT AND WITH CONTRAST TECHNIQUE: Multiplanar, multiecho pulse sequences of the brain and surrounding structures were obtained without and with intravenous contrast. Multiplanar, multiecho pulse  sequences of the orbits and surrounding structures were obtained including fat saturation techniques, before and after intravenous contrast administration. CONTRAST:  10mL GADAVIST GADOBUTROL 1 MMOL/ML IV SOLN COMPARISON:  Same-day CT head. FINDINGS: MRI HEAD FINDINGS Brain: There is no acute intracranial hemorrhage, extra-axial fluid collection, or acute infarct. Parenchymal volume is normal. The ventricles are normal in size. Parenchymal signal is normal. The pituitary and suprasellar region are normal. There is no mass lesion or abnormal enhancement. There is no mass effect or midline shift. Vascular: Normal flow voids. Skull and upper cervical spine: Normal marrow signal. Other: None. MRI ORBITS FINDINGS Orbits: Right: The globe is normal. The orbital fat is normal. The extraocular muscles are normal. The optic nerve is normal. The lacrimal  gland is normal. There is no abnormal enhancement. Left: The globe is normal. The orbital fat is normal. The extraocular muscles are normal. The optic nerve is normal. The lacrimal gland is normal. There is no abnormal enhancement. The optic chiasm is normal. Visualized sinuses: There is mild mucosal thickening in the paranasal sinuses. Soft tissues: Unremarkable. IMPRESSION: Unremarkable MRI of the brain and orbits with no acute finding. Electronically Signed   By: Lesia Hausen M.D.   On: 09/12/2022 19:58   CT Head Wo Contrast  Result Date: 09/12/2022 CLINICAL DATA:  Vision changes in the right eye for 4 days EXAM: CT HEAD WITHOUT CONTRAST TECHNIQUE: Contiguous axial images were obtained from the base of the skull through the vertex without intravenous contrast. RADIATION DOSE REDUCTION: This exam was performed according to the departmental dose-optimization program which includes automated exposure control, adjustment of the mA and/or kV according to patient size and/or use of iterative reconstruction technique. COMPARISON:  None Available. FINDINGS: Brain: No evidence of acute infarction, hemorrhage, hydrocephalus, extra-axial collection or mass lesion/mass effect. Vascular: No hyperdense vessel or unexpected calcification. Skull: Normal. Negative for fracture or focal lesion. Sinuses/Orbits: Negative IMPRESSION: Normal head CT. Electronically Signed   By: Tiburcio Pea M.D.   On: 09/12/2022 06:11    EKG: Independently reviewed.   Assessment and Plan: * Papilledema of both eyes Papilledema of both eyes with R eye visual field deficit on opthalmologic exam today. MR brain and MR orbits w and w/o is negative. Suspicion at this point = [facility] EDP unable to get LP to confirm diagnosis Admitting for IR LP in AM D/w Dr. Wilford Corner with neurology: Suspicion high enough to start empiric diamox 250mg  BID at this point See his plan of care note for further details.      Advance Care Planning:   Code  Status: Full Code  Consults: Neuro  Family Communication: Family at bedside  Severity of Illness: The appropriate patient status for this patient is OBSERVATION. Observation status is judged to be reasonable and necessary in order to provide the required intensity of service to ensure the patient's safety. The patient's presenting symptoms, physical exam findings, and initial radiographic and laboratory data in the context of their medical condition is felt to place them at decreased risk for further clinical deterioration. Furthermore, it is anticipated that the patient will be medically stable for discharge from the hospital within 2 midnights of admission.   Author: Hillary Bow., DO 09/12/2022 10:13 PM  For on call review [email or web address].

## 2022-09-12 NOTE — Plan of Care (Signed)
Called by ED regarding this patient who was sent from ophthalmology for bilateral papilledema.  MRI brain and orbits unremarkable.  No evidence of optic neuritis. 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 Recommend LP with opening pressure and drain CSF for closing pressure that is in the normal range. Will need LP under fluoroscopy according to the ER. I would recommend starting Diamox 250 twice daily and increasing to 500 twice daily if tolerated well and outpatient follow-up with ophthalmology and neurology.  If the Diamox does not show effect, she might need consideration for an advanced optic nerve fenestration procedure which is only done at very few tertiary centers-would defer that to ophthalmology.   Plan discussed initially with the ED provider Christian PA and later with Dr. Julian Reil who is admitting.  Please call inpatient neurology with questions as needed  -- Milon Dikes, MD Neurologist [facility] Pager: [phone]

## 2022-09-12 NOTE — ED Provider Notes (Signed)
[place] EMERGENCY DEPARTMENT AT [facility] Provider Note   CSN: [id number] Arrival date & time: 09/12/22  1310     History  Chief Complaint  Patient presents with   Eye Problem    Monica [name] is a 22 y.o. female with past medical history significant for obesity, anxiety, depression who presents with concern for erosive worsening of lesion on right, with black spots.  She reports that symptoms been ongoing for 4 days, but worsened today.  She denies any left-sided symptoms, she denies any eye pain.  She denies any numbness, tingling of her arms, legs.  She denies any coordination problems, difficulty with speech, confusion.  She was seen at [facility], had an unremarkable workup, CT, and followed up with Dr. Cleon Gustin with ophthalmology who sent her to the ED for MR brain, MRI orbits with and without contrast as well as possible neurology consult and lumbar puncture.   Eye Problem      Home Medications Prior to Admission medications   Medication Sig Start Date End Date Taking? Authorizing Provider  albuterol (PROVENTIL) (2.5 MG/3ML) 0.083% nebulizer solution Take 3 mLs (2.5 mg total) by nebulization every 4 (four) hours as needed for wheezing or shortness of breath (coughing fits). 04/05/21  Yes Ellamae Sia, DO  cetirizine (ZYRTEC ALLERGY) 10 MG tablet Take 1 tablet (10 mg total) by mouth 2 (two) times daily. Patient taking differently: Take 10 mg by mouth 2 (two) times daily as needed for allergies. 04/05/21  Yes Ellamae Sia, DO  ibuprofen (ADVIL) 600 MG tablet Take 1 tablet (600 mg total) by mouth every 6 (six) hours as needed. Patient taking differently: Take 600 mg by mouth every 6 (six) hours as needed for headache. 05/15/22  Yes Sherian Maroon A, PA  triamcinolone cream (KENALOG) 0.1 % Apply 1 Application topically daily as needed (for hives). 02/17/21  Yes [provider]  VENTOLIN HFA 108 (90 Base) MCG/ACT inhaler INHALE 2 PUFFS INTO THE LUNGS EVERY 4  (FOUR) HOURS AS NEEDED FOR WHEEZING OR SHORTNESS OF BREATH (COUGHING FITS). Patient taking differently: Inhale 2 puffs into the lungs every 4 (four) hours as needed for wheezing or shortness of breath. 10/25/21  Yes Ellamae Sia, DO  EPINEPHrine 0.3 mg/0.3 mL IJ SOAJ injection Inject 0.3 mg into the muscle as needed for anaphylaxis. Patient not taking: Reported on 09/12/2022 03/18/21   Milagros Loll, MD      Allergies    Soy allergy    Review of Systems   Review of Systems  Eyes:  Positive for visual disturbance.  All other systems reviewed and are negative.   Physical Exam Updated Vital Signs BP (!) 104/51 (BP Location: Right Arm)   Pulse 99   Temp 97.7 F (36.5 C) (Oral)   Resp 18   LMP 09/12/2022 (Exact Date)   SpO2 100%  Physical Exam Vitals and nursing note reviewed.  Constitutional:      General: She is not in acute distress.    Appearance: Normal appearance.  HENT:     Head: Normocephalic and atraumatic.  Eyes:     General:        Right eye: No discharge.        Left eye: No discharge.  Cardiovascular:     Rate and Rhythm: Normal rate and regular rhythm.     Heart sounds: No murmur heard.    No friction rub. No gallop.  Pulmonary:     Effort: Pulmonary effort  is normal.     Breath sounds: Normal breath sounds.  Abdominal:     General: Bowel sounds are normal.     Palpations: Abdomen is soft.  Skin:    General: Skin is warm and dry.     Capillary Refill: Capillary refill takes less than 2 seconds.  Neurological:     Mental Status: She is alert and oriented to person, place, and time.     Comments: Cranial nerves III through XII grossly intact.  Intact finger-nose, intact heel-to-shin.  Romberg negative, gait normal.  Alert and oriented x3.  Moves all 4 limbs spontaneously, normal coordination.  No pronator drift.  Intact strength 5 out of 5 bilateral upper and lower extremities.  Patient has some persistent right eye visual field deficits, she reports  generalized darkness with specific black spots of right eye only.  Normal vision in left eye.  Normal eye coordination throughout, no evidence of external eye injury.  Papilledema was noted on ophthalmologist exam earlier today.  Psychiatric:        Mood and Affect: Mood normal.        Behavior: Behavior normal.     ED Results / Procedures / Treatments   Labs (all labs ordered are listed, but only abnormal results are displayed) Labs Reviewed  BASIC METABOLIC PANEL - Abnormal; Notable for the following components:      Result Value   Potassium 3.3 (*)    Glucose, Bld 109 (*)    Calcium 8.6 (*)    All other components within normal limits  CBC  HIV ANTIBODY (ROUTINE TESTING W REFLEX)    EKG None  Radiology MR Brain W and Wo Contrast  Result Date: 09/12/2022 CLINICAL DATA:  Blurry vision in the right eye, bilateral disc edema and visual field defect in the right eye. EXAM: MRI HEAD AND ORBITS WITHOUT AND WITH CONTRAST TECHNIQUE: Multiplanar, multiecho pulse sequences of the brain and surrounding structures were obtained without and with intravenous contrast. Multiplanar, multiecho pulse sequences of the orbits and surrounding structures were obtained including fat saturation techniques, before and after intravenous contrast administration. CONTRAST:  10mL GADAVIST GADOBUTROL 1 MMOL/ML IV SOLN COMPARISON:  Same-day CT head. FINDINGS: MRI HEAD FINDINGS Brain: There is no acute intracranial hemorrhage, extra-axial fluid collection, or acute infarct. Parenchymal volume is normal. The ventricles are normal in size. Parenchymal signal is normal. The pituitary and suprasellar region are normal. There is no mass lesion or abnormal enhancement. There is no mass effect or midline shift. Vascular: Normal flow voids. Skull and upper cervical spine: Normal marrow signal. Other: None. MRI ORBITS FINDINGS Orbits: Right: The globe is normal. The orbital fat is normal. The extraocular muscles are normal. The  optic nerve is normal. The lacrimal gland is normal. There is no abnormal enhancement. Left: The globe is normal. The orbital fat is normal. The extraocular muscles are normal. The optic nerve is normal. The lacrimal gland is normal. There is no abnormal enhancement. The optic chiasm is normal. Visualized sinuses: There is mild mucosal thickening in the paranasal sinuses. Soft tissues: Unremarkable. IMPRESSION: Unremarkable MRI of the brain and orbits with no acute finding. Electronically Signed   By: Lesia Hausen M.D.   On: 09/12/2022 19:58   MR ORBITS W WO CONTRAST  Result Date: 09/12/2022 CLINICAL DATA:  Blurry vision in the right eye, bilateral disc edema and visual field defect in the right eye. EXAM: MRI HEAD AND ORBITS WITHOUT AND WITH CONTRAST TECHNIQUE: Multiplanar, multiecho pulse sequences of  the brain and surrounding structures were obtained without and with intravenous contrast. Multiplanar, multiecho pulse sequences of the orbits and surrounding structures were obtained including fat saturation techniques, before and after intravenous contrast administration. CONTRAST:  10mL GADAVIST GADOBUTROL 1 MMOL/ML IV SOLN COMPARISON:  Same-day CT head. FINDINGS: MRI HEAD FINDINGS Brain: There is no acute intracranial hemorrhage, extra-axial fluid collection, or acute infarct. Parenchymal volume is normal. The ventricles are normal in size. Parenchymal signal is normal. The pituitary and suprasellar region are normal. There is no mass lesion or abnormal enhancement. There is no mass effect or midline shift. Vascular: Normal flow voids. Skull and upper cervical spine: Normal marrow signal. Other: None. MRI ORBITS FINDINGS Orbits: Right: The globe is normal. The orbital fat is normal. The extraocular muscles are normal. The optic nerve is normal. The lacrimal gland is normal. There is no abnormal enhancement. Left: The globe is normal. The orbital fat is normal. The extraocular muscles are normal. The optic  nerve is normal. The lacrimal gland is normal. There is no abnormal enhancement. The optic chiasm is normal. Visualized sinuses: There is mild mucosal thickening in the paranasal sinuses. Soft tissues: Unremarkable. IMPRESSION: Unremarkable MRI of the brain and orbits with no acute finding. Electronically Signed   By: Lesia Hausen M.D.   On: 09/12/2022 19:58   CT Head Wo Contrast  Result Date: 09/12/2022 CLINICAL DATA:  Vision changes in the right eye for 4 days EXAM: CT HEAD WITHOUT CONTRAST TECHNIQUE: Contiguous axial images were obtained from the base of the skull through the vertex without intravenous contrast. RADIATION DOSE REDUCTION: This exam was performed according to the departmental dose-optimization program which includes automated exposure control, adjustment of the mA and/or kV according to patient size and/or use of iterative reconstruction technique. COMPARISON:  None Available. FINDINGS: Brain: No evidence of acute infarction, hemorrhage, hydrocephalus, extra-axial collection or mass lesion/mass effect. Vascular: No hyperdense vessel or unexpected calcification. Skull: Normal. Negative for fracture or focal lesion. Sinuses/Orbits: Negative IMPRESSION: Normal head CT. Electronically Signed   By: Tiburcio Pea M.D.   On: 09/12/2022 06:11    Procedures Procedures    Medications Ordered in ED Medications  acetaZOLAMIDE (DIAMOX) tablet 250 mg (250 mg Oral Given 09/12/22 2247)  acetaminophen (TYLENOL) tablet 650 mg (has no administration in time range)    Or  acetaminophen (TYLENOL) suppository 650 mg (has no administration in time range)  ondansetron (ZOFRAN) tablet 4 mg (has no administration in time range)    Or  ondansetron (ZOFRAN) injection 4 mg (has no administration in time range)  gadobutrol (GADAVIST) 1 MMOL/ML injection 10 mL (10 mLs Intravenous Contrast Given 09/12/22 1753)  ketorolac (TORADOL) 15 MG/ML injection 15 mg (15 mg Intravenous Given 09/12/22 2141)  potassium  chloride SA (KLOR-CON M) CR tablet 40 mEq (40 mEq Oral Given 09/12/22 2247)    ED Course/ Medical Decision Making/ A&P                             Medical Decision Making Amount and/or Complexity of Data Reviewed Labs: ordered. Radiology: ordered.  Risk Prescription drug management. Decision regarding hospitalization.   This patient is a 22 y.o. female who presents to the ED for concern of visual changes, this involves an extensive number of treatment options, and is a complaint that carries with it a high risk of complications and morbidity. The emergent differential diagnosis prior to evaluation includes, but is not limited to,  MS, IICH, as she had already had an eye exam earlier this morning, overall low suspicion for meningitis retinal artery/vein hemorrhage. This is not an exhaustive differential.   Past Medical History / Co-morbidities / Social History: Asthma, eczema, depression  Additional history: Chart reviewed. Pertinent results include: Reviewed the CT head without contrast, lumbar performed earlier today at [facility] long prior to patient being seen by the eye doctor, attempted to review the evaluation by the ophthalmologist but I cannot find their note in our system, report given to Dr. Margarita Grizzle however and her note reviewed  Physical Exam: Physical exam performed. The pertinent findings include: Patient with overall stable vital signs, she is obese, she does have right-sided visual field deficits but no evidence of external eye damage, no other focal neurologic deficits on my exam.  She is without headache.  Lab Tests: I ordered, and personally interpreted labs.  The pertinent results include: BMP notable for mild hypokalemia,, potassium 3.3, CBC unremarkable.    Imaging Studies: I ordered imaging studies including MRI brain without contrast, MR orbits with without contrast. I independently visualized and interpreted imaging which showed no evidence of acute  intracranial abnormality or explanation for patient's visual deficit. I agree with the radiologist interpretation.   Medications: I ordered medication including  Potassium for hypokalemia, Toradol for pain.  Consultations Obtained: I requested consultation with the just, spoke with Dr. Jerrell Belfast who discussed that we should perform lumbar puncture to evaluate for elevated CSF pressure patient spoke with the hospitalist Dr. Julian Reil who will admit so that patient can get Diamox, and repeat LP attempt under guidance tomorrow morning,  and discussed lab and imaging findings as well as pertinent plan - they recommend: Admission to the hospital   Disposition: After consideration of the diagnostic results and the patients response to treatment, I feel that patient would benefit from admission for radiology guided LP in the morning.   I discussed this case with my attending physician Dr. Wallace Cullens who cosigned this note including patient's presenting symptoms, physical exam, and planned diagnostics and interventions. Attending physician stated agreement with plan or made changes to plan which were implemented.    Final Clinical Impression(s) / ED Diagnoses Final diagnoses:  Vision loss of right eye    Rx / DC Orders ED Discharge Orders     None         Olene Floss, PA-C 09/12/22 2305    Tanda Rockers A, DO 09/13/22 0015

## 2022-09-13 ENCOUNTER — Observation Stay (HOSPITAL_COMMUNITY): Payer: Medicaid Other

## 2022-09-13 DIAGNOSIS — Z6841 Body Mass Index (BMI) 40.0 and over, adult: Secondary | ICD-10-CM | POA: Diagnosis not present

## 2022-09-13 DIAGNOSIS — Z79899 Other long term (current) drug therapy: Secondary | ICD-10-CM | POA: Diagnosis not present

## 2022-09-13 DIAGNOSIS — H547 Unspecified visual loss: Secondary | ICD-10-CM

## 2022-09-13 DIAGNOSIS — E876 Hypokalemia: Secondary | ICD-10-CM | POA: Diagnosis present

## 2022-09-13 DIAGNOSIS — E669 Obesity, unspecified: Secondary | ICD-10-CM | POA: Diagnosis not present

## 2022-09-13 DIAGNOSIS — H5340 Unspecified visual field defects: Secondary | ICD-10-CM | POA: Diagnosis present

## 2022-09-13 DIAGNOSIS — H5461 Unqualified visual loss, right eye, normal vision left eye: Secondary | ICD-10-CM | POA: Diagnosis not present

## 2022-09-13 DIAGNOSIS — Z7951 Long term (current) use of inhaled steroids: Secondary | ICD-10-CM | POA: Diagnosis not present

## 2022-09-13 DIAGNOSIS — H4710 Unspecified papilledema: Secondary | ICD-10-CM | POA: Diagnosis not present

## 2022-09-13 DIAGNOSIS — G932 Benign intracranial hypertension: Secondary | ICD-10-CM | POA: Diagnosis not present

## 2022-09-13 DIAGNOSIS — J45909 Unspecified asthma, uncomplicated: Secondary | ICD-10-CM | POA: Diagnosis present

## 2022-09-13 DIAGNOSIS — Z825 Family history of asthma and other chronic lower respiratory diseases: Secondary | ICD-10-CM | POA: Diagnosis not present

## 2022-09-13 LAB — CSF CULTURE W GRAM STAIN

## 2022-09-13 LAB — PROTEIN AND GLUCOSE, CSF
Glucose, CSF: 63 mg/dL (ref 40–70)
Total  Protein, CSF: 15 mg/dL (ref 15–45)

## 2022-09-13 LAB — HIV ANTIBODY (ROUTINE TESTING W REFLEX): HIV Screen 4th Generation wRfx: NONREACTIVE

## 2022-09-13 LAB — CSF CELL COUNT WITH DIFFERENTIAL
RBC Count, CSF: 1 /mm3 — ABNORMAL HIGH
Tube #: 3
WBC, CSF: 1 /mm3 (ref 0–5)

## 2022-09-13 MED ORDER — LIDOCAINE HCL (PF) 1 % IJ SOLN
5.0000 mL | Freq: Once | INTRAMUSCULAR | Status: AC
  Administered 2022-09-13: 5 mL via INTRADERMAL
  Filled 2022-09-13: qty 5

## 2022-09-13 NOTE — Plan of Care (Signed)

## 2022-09-13 NOTE — Hospital Course (Signed)
Monica [name] is a 22 y.o. female with medical history significant of obesity presented to the ED with loss of vision in the right eye for 4 days which was painless.  Patient was referred to ophthalmology in on examination had bilateral papilledema and visual field deficit in the right eye so was sent back to the ED for further evaluation including MRI and lumbar puncture.  Assessment and plan.  Papilledema of both eyes Papilledema of both eyes with R eye visual field on ophthalmological examination.  MR brain and MR orbits with and without contrast was negative.  Suspected idiopathic intracranial hypertension.  EDP was unable to get lumbar puncture.  IR has been consulted for lumbar puncture.  Admitting provider has spoken with neurology Dr. Jerrell Belfast.  Suspicion is high enough to start empiric Diamox.  Currently NPO.  Awaiting IR guided lumbar puncture.  Therapy nonreactive.  No leukocytosis.  Mild hypokalemia.  Was replenished with oral potassium.  Obesity.  Would benefit from lifestyle modification.

## 2022-09-13 NOTE — Progress Notes (Signed)
PROGRESS NOTE    Monica [name]  ZOX:[id number] DOB: November 09, 2000 DOA: 09/12/2022 PCP: [facility]    Brief Narrative:  Monica [name] is a 22 y.o. female with medical history significant of obesity presented to the ED with loss of vision in the right eye for 4 days which was painless.  Patient was referred to ophthalmology in on examination had bilateral papilledema and visual field deficit in the right eye so was sent back to the ED for further evaluation including MRI and lumbar puncture.  Assessment and plan.  Papilledema of both eyes Papilledema of both eyes with R eye visual field on ophthalmological examination.  MR brain and MR orbits with and without contrast was negative.  Suspected idiopathic intracranial hypertension.  EDP was unable to get lumbar puncture.  IR has been consulted for lumbar puncture.  Admitting provider has spoken with neurology Dr. Wilford Corner.  Suspicion is high enough to start empiric Diamox.  Currently NPO.  Awaiting IR guided lumbar puncture.  HIV nonreactive.  No leukocytosis.  Mild hypokalemia.  Was replenished with oral potassium.  Obesity.  There is no height or weight on file to calculate BMI. Would benefit from lifestyle modification.     DVT prophylaxis: SCDs Start: 09/12/22 2209   Code Status:     Code Status: Full Code  Disposition: home  Status is: Observation  The patient will require care spanning > 2 midnights and should be moved to inpatient because: Papilledema with visual loss, need for lumbar puncture and further assessment.   Family Communication: None at bedside  Consultants:  Neurology Interventional radiology  Procedures:  Failed lumbar puncture at bedside.  Antimicrobials:  None  Anti-infectives (From admission, onward)    None       Subjective: Today, patient was seen and examined at bedside.  Denies any nausea vomiting headache at this time but has visual loss.  Denies focal  weakness.  Objective: Vitals:   09/13/22 0115 09/13/22 0305 09/13/22 0500 09/13/22 0732  BP: (!) 112/58 (!) 142/74 129/74 (!) 110/55  Pulse: 61 93 72 72  Resp: 18 18 18 16   Temp:  98.5 F (36.9 C) 98.4 F (36.9 C) 97.7 F (36.5 C)  TempSrc:  Oral Oral Oral  SpO2: 96% 99% 100% 100%   No intake or output data in the 24 hours ending 09/13/22 1351 There were no vitals filed for this visit.  Physical Examination: There is no height or weight on file to calculate BMI.  General: Obese built, not in obvious distress HENT:   No scleral pallor or icterus noted. Oral mucosa is moist.  Chest:  Clear breath sounds.  Diminished breath sounds bilaterally. No crackles or wheezes.  CVS: S1 &S2 heard. No murmur.  Regular rate and rhythm. Abdomen: Soft, nontender, nondistended.  Bowel sounds are heard.   Extremities: No cyanosis, clubbing or edema.  Peripheral pulses are palpable. Psych: Alert, awake and oriented, normal mood CNS: Visual field deficit right eye.  Power equal in all extremities.   Skin: Warm and dry.  No rashes noted.  Data Reviewed:   CBC: Recent Labs  Lab 09/12/22 1601  WBC 10.4  HGB 12.5  HCT 36.9  MCV 84.6  PLT 326    Basic Metabolic Panel: Recent Labs  Lab 09/12/22 1601  NA 135  K 3.3*  CL 106  CO2 22  GLUCOSE 109*  BUN 7  CREATININE 0.75  CALCIUM 8.6*    Liver Function Tests: No  results for input(s): "AST", "ALT", "ALKPHOS", "BILITOT", "PROT", "ALBUMIN" in the last 168 hours.   Radiology Studies: MR Brain W and Wo Contrast  Result Date: 09/12/2022 CLINICAL DATA:  Blurry vision in the right eye, bilateral disc edema and visual field defect in the right eye. EXAM: MRI HEAD AND ORBITS WITHOUT AND WITH CONTRAST TECHNIQUE: Multiplanar, multiecho pulse sequences of the brain and surrounding structures were obtained without and with intravenous contrast. Multiplanar, multiecho pulse sequences of the orbits and surrounding structures were obtained including  fat saturation techniques, before and after intravenous contrast administration. CONTRAST:  10mL GADAVIST GADOBUTROL 1 MMOL/ML IV SOLN COMPARISON:  Same-day CT head. FINDINGS: MRI HEAD FINDINGS Brain: There is no acute intracranial hemorrhage, extra-axial fluid collection, or acute infarct. Parenchymal volume is normal. The ventricles are normal in size. Parenchymal signal is normal. The pituitary and suprasellar region are normal. There is no mass lesion or abnormal enhancement. There is no mass effect or midline shift. Vascular: Normal flow voids. Skull and upper cervical spine: Normal marrow signal. Other: None. MRI ORBITS FINDINGS Orbits: Right: The globe is normal. The orbital fat is normal. The extraocular muscles are normal. The optic nerve is normal. The lacrimal gland is normal. There is no abnormal enhancement. Left: The globe is normal. The orbital fat is normal. The extraocular muscles are normal. The optic nerve is normal. The lacrimal gland is normal. There is no abnormal enhancement. The optic chiasm is normal. Visualized sinuses: There is mild mucosal thickening in the paranasal sinuses. Soft tissues: Unremarkable. IMPRESSION: Unremarkable MRI of the brain and orbits with no acute finding. Electronically Signed   By: Lesia Hausen M.D.   On: 09/12/2022 19:58   MR ORBITS W WO CONTRAST  Result Date: 09/12/2022 CLINICAL DATA:  Blurry vision in the right eye, bilateral disc edema and visual field defect in the right eye. EXAM: MRI HEAD AND ORBITS WITHOUT AND WITH CONTRAST TECHNIQUE: Multiplanar, multiecho pulse sequences of the brain and surrounding structures were obtained without and with intravenous contrast. Multiplanar, multiecho pulse sequences of the orbits and surrounding structures were obtained including fat saturation techniques, before and after intravenous contrast administration. CONTRAST:  10mL GADAVIST GADOBUTROL 1 MMOL/ML IV SOLN COMPARISON:  Same-day CT head. FINDINGS: MRI HEAD  FINDINGS Brain: There is no acute intracranial hemorrhage, extra-axial fluid collection, or acute infarct. Parenchymal volume is normal. The ventricles are normal in size. Parenchymal signal is normal. The pituitary and suprasellar region are normal. There is no mass lesion or abnormal enhancement. There is no mass effect or midline shift. Vascular: Normal flow voids. Skull and upper cervical spine: Normal marrow signal. Other: None. MRI ORBITS FINDINGS Orbits: Right: The globe is normal. The orbital fat is normal. The extraocular muscles are normal. The optic nerve is normal. The lacrimal gland is normal. There is no abnormal enhancement. Left: The globe is normal. The orbital fat is normal. The extraocular muscles are normal. The optic nerve is normal. The lacrimal gland is normal. There is no abnormal enhancement. The optic chiasm is normal. Visualized sinuses: There is mild mucosal thickening in the paranasal sinuses. Soft tissues: Unremarkable. IMPRESSION: Unremarkable MRI of the brain and orbits with no acute finding. Electronically Signed   By: Lesia Hausen M.D.   On: 09/12/2022 19:58   CT Head Wo Contrast  Result Date: 09/12/2022 CLINICAL DATA:  Vision changes in the right eye for 4 days EXAM: CT HEAD WITHOUT CONTRAST TECHNIQUE: Contiguous axial images were obtained from the base of the skull  through the vertex without intravenous contrast. RADIATION DOSE REDUCTION: This exam was performed according to the departmental dose-optimization program which includes automated exposure control, adjustment of the mA and/or kV according to patient size and/or use of iterative reconstruction technique. COMPARISON:  None Available. FINDINGS: Brain: No evidence of acute infarction, hemorrhage, hydrocephalus, extra-axial collection or mass lesion/mass effect. Vascular: No hyperdense vessel or unexpected calcification. Skull: Normal. Negative for fracture or focal lesion. Sinuses/Orbits: Negative IMPRESSION: Normal  head CT. Electronically Signed   By: Tiburcio Pea M.D.   On: 09/12/2022 06:11      LOS: 0 days     Joycelyn Das, MD [facility] Available via Epic secure chat 7am-7pm After these hours, please refer to coverage provider listed on [email or web address] 09/13/2022, 1:51 PM

## 2022-09-13 NOTE — ED Notes (Signed)
ED TO INPATIENT HANDOFF REPORT  ED Nurse Name and Phone #: Juliette Alcide RN [phone]  S Name/Age/Gender Monica [name] 22 y.o. female Room/Bed: 045C/045C  Code Status   Code Status: Full Code  Home/SNF/Other Home Patient oriented to: self, place, time, and situation Is this baseline? Yes   Triage Complete: Triage complete  Chief Complaint Papilledema of both eyes [H47.10]  Triage Note Pt with darker vision than normal on Friday, in her right eye only. Seen at [facility] for same four days ago and told to follow up with eye doctor. Went to that appointment and was called back and told to come to the ED for a LP, MRI, and other testing.    Allergies Allergies  Allergen Reactions   Soy Allergy Hives    Level of Care/Admitting Diagnosis ED Disposition     ED Disposition  Admit   Condition  --   Comment  Hospital Area: [facility] [[id number]]  Level of Care: Med-Surg [16]  May place patient in observation at [facility] or [facility] if equivalent level of care is available:: No  Covid Evaluation: Asymptomatic - no recent exposure (last 10 days) testing not required  Diagnosis: Papilledema of both eyes [[id number]]  Admitting Physician: Hillary Bow [[id number]]  Attending Physician: Hillary Bow [[id number]]          B Medical/Surgery History Past Medical History:  Diagnosis Date   Asthma    Eczema    Urticaria 04/05/2021   Past Surgical History:  Procedure Laterality Date   ORIF ANKLE FRACTURE Right 09/03/2018   Procedure: RIGHT OPEN REDUCTION INTERNAL FIXATION (ORIF) ANKLE FRACTURE;  Surgeon: Cammy Copa, MD;  Location: [facility] OR;  Service: Orthopedics;  Laterality: Right;   WISDOM TOOTH EXTRACTION  11/2016     A IV Location/Drains/Wounds Patient Lines/Drains/Airways Status     Active Line/Drains/Airways     Name Placement date Placement time Site Days   Peripheral IV 09/12/22 18 G Left Antecubital 09/12/22  1606  Antecubital  1             Intake/Output Last 24 hours No intake or output data in the 24 hours ending 09/13/22 0500  Labs/Imaging Results for orders placed or performed during the hospital encounter of 09/12/22 (from the past 48 hour(s))  CBC     Status: None   Collection Time: 09/12/22  4:01 PM  Result Value Ref Range   WBC 10.4 4.0 - 10.5 K/uL   RBC 4.36 3.87 - 5.11 MIL/uL   Hemoglobin 12.5 12.0 - 15.0 g/dL   HCT 91.4 78.2 - 95.6 %   MCV 84.6 80.0 - 100.0 fL   MCH 28.7 26.0 - 34.0 pg   MCHC 33.9 30.0 - 36.0 g/dL   RDW 21.3 08.6 - 57.8 %   Platelets 326 150 - 400 K/uL   nRBC 0.0 0.0 - 0.2 %    Comment: Performed at [facility], [address] [place], [place] [address]  Basic metabolic panel     Status: Abnormal   Collection Time: 09/12/22  4:01 PM  Result Value Ref Range   Sodium 135 135 - 145 mmol/L   Potassium 3.3 (L) 3.5 - 5.1 mmol/L   Chloride 106 98 - 111 mmol/L   CO2 22 22 - 32 mmol/L   Glucose, Bld 109 (H) 70 - 99 mg/dL    Comment: Glucose reference range applies only to samples taken after fasting for at least 8 hours.  BUN 7 6 - 20 mg/dL   Creatinine, Ser 4.09 0.44 - 1.00 mg/dL   Calcium 8.6 (L) 8.9 - 10.3 mg/dL   GFR, Estimated >81 >19 mL/min    Comment: (NOTE) Calculated using the CKD-EPI Creatinine Equation (2021)    Anion gap 7 5 - 15    Comment: Performed at [facility], [address], [place], [place] [address]  HIV Antibody (routine testing w rflx)     Status: None   Collection Time: 09/12/22 10:58 PM  Result Value Ref Range   HIV Screen 4th Generation wRfx Non Reactive Non Reactive    Comment: Performed at [facility], [address], [place], [place] [address]   MR Brain W and Wo Contrast  Result Date: 09/12/2022 CLINICAL DATA:  Blurry vision in the right eye, bilateral disc edema and visual field defect in the right eye. EXAM: MRI HEAD AND ORBITS WITHOUT AND WITH CONTRAST TECHNIQUE: Multiplanar, multiecho pulse sequences of the brain and  surrounding structures were obtained without and with intravenous contrast. Multiplanar, multiecho pulse sequences of the orbits and surrounding structures were obtained including fat saturation techniques, before and after intravenous contrast administration. CONTRAST:  10mL GADAVIST GADOBUTROL 1 MMOL/ML IV SOLN COMPARISON:  Same-day CT head. FINDINGS: MRI HEAD FINDINGS Brain: There is no acute intracranial hemorrhage, extra-axial fluid collection, or acute infarct. Parenchymal volume is normal. The ventricles are normal in size. Parenchymal signal is normal. The pituitary and suprasellar region are normal. There is no mass lesion or abnormal enhancement. There is no mass effect or midline shift. Vascular: Normal flow voids. Skull and upper cervical spine: Normal marrow signal. Other: None. MRI ORBITS FINDINGS Orbits: Right: The globe is normal. The orbital fat is normal. The extraocular muscles are normal. The optic nerve is normal. The lacrimal gland is normal. There is no abnormal enhancement. Left: The globe is normal. The orbital fat is normal. The extraocular muscles are normal. The optic nerve is normal. The lacrimal gland is normal. There is no abnormal enhancement. The optic chiasm is normal. Visualized sinuses: There is mild mucosal thickening in the paranasal sinuses. Soft tissues: Unremarkable. IMPRESSION: Unremarkable MRI of the brain and orbits with no acute finding. Electronically Signed   By: Lesia Hausen M.D.   On: 09/12/2022 19:58   MR ORBITS W WO CONTRAST  Result Date: 09/12/2022 CLINICAL DATA:  Blurry vision in the right eye, bilateral disc edema and visual field defect in the right eye. EXAM: MRI HEAD AND ORBITS WITHOUT AND WITH CONTRAST TECHNIQUE: Multiplanar, multiecho pulse sequences of the brain and surrounding structures were obtained without and with intravenous contrast. Multiplanar, multiecho pulse sequences of the orbits and surrounding structures were obtained including fat  saturation techniques, before and after intravenous contrast administration. CONTRAST:  10mL GADAVIST GADOBUTROL 1 MMOL/ML IV SOLN COMPARISON:  Same-day CT head. FINDINGS: MRI HEAD FINDINGS Brain: There is no acute intracranial hemorrhage, extra-axial fluid collection, or acute infarct. Parenchymal volume is normal. The ventricles are normal in size. Parenchymal signal is normal. The pituitary and suprasellar region are normal. There is no mass lesion or abnormal enhancement. There is no mass effect or midline shift. Vascular: Normal flow voids. Skull and upper cervical spine: Normal marrow signal. Other: None. MRI ORBITS FINDINGS Orbits: Right: The globe is normal. The orbital fat is normal. The extraocular muscles are normal. The optic nerve is normal. The lacrimal gland is normal. There is no abnormal enhancement. Left: The globe is normal. The orbital fat  is normal. The extraocular muscles are normal. The optic nerve is normal. The lacrimal gland is normal. There is no abnormal enhancement. The optic chiasm is normal. Visualized sinuses: There is mild mucosal thickening in the paranasal sinuses. Soft tissues: Unremarkable. IMPRESSION: Unremarkable MRI of the brain and orbits with no acute finding. Electronically Signed   By: Lesia Hausen M.D.   On: 09/12/2022 19:58   CT Head Wo Contrast  Result Date: 09/12/2022 CLINICAL DATA:  Vision changes in the right eye for 4 days EXAM: CT HEAD WITHOUT CONTRAST TECHNIQUE: Contiguous axial images were obtained from the base of the skull through the vertex without intravenous contrast. RADIATION DOSE REDUCTION: This exam was performed according to the departmental dose-optimization program which includes automated exposure control, adjustment of the mA and/or kV according to patient size and/or use of iterative reconstruction technique. COMPARISON:  None Available. FINDINGS: Brain: No evidence of acute infarction, hemorrhage, hydrocephalus, extra-axial collection or mass  lesion/mass effect. Vascular: No hyperdense vessel or unexpected calcification. Skull: Normal. Negative for fracture or focal lesion. Sinuses/Orbits: Negative IMPRESSION: Normal head CT. Electronically Signed   By: Tiburcio Pea M.D.   On: 09/12/2022 06:11    Pending Labs Unresulted Labs (From admission, onward)    None       Vitals/Pain Today's Vitals   09/12/22 2110 09/12/22 2248 09/13/22 0115 09/13/22 0305  BP: (!) 104/51  (!) 112/58 (!) 142/74  Pulse: 99  61 93  Resp: 18  18 18   Temp:  97.7 F (36.5 C)  98.5 F (36.9 C)  TempSrc:  Oral  Oral  SpO2: 100%  96% 99%  PainSc:    0-No pain    Isolation Precautions No active isolations  Medications Medications  acetaZOLAMIDE (DIAMOX) tablet 250 mg (250 mg Oral Given 09/12/22 2247)  acetaminophen (TYLENOL) tablet 650 mg (has no administration in time range)    Or  acetaminophen (TYLENOL) suppository 650 mg (has no administration in time range)  ondansetron (ZOFRAN) tablet 4 mg (has no administration in time range)    Or  ondansetron (ZOFRAN) injection 4 mg (has no administration in time range)  gadobutrol (GADAVIST) 1 MMOL/ML injection 10 mL (10 mLs Intravenous Contrast Given 09/12/22 1753)  ketorolac (TORADOL) 15 MG/ML injection 15 mg (15 mg Intravenous Given 09/12/22 2141)  potassium chloride SA (KLOR-CON M) CR tablet 40 mEq (40 mEq Oral Given 09/12/22 2247)    Mobility walks     Focused Assessments   R Recommendations: See Admitting Provider Note  Report given to:   Additional Notes: Pt A&Ox4. Ambulatory. Plan is for LP

## 2022-09-13 NOTE — ED Provider Notes (Signed)
.  Lumbar Puncture  Date/Time: 09/13/2022 12:14 AM  Performed by: Sloan Leiter, DO Authorized by: Sloan Leiter, DO   Consent:    Consent obtained:  Written and verbal   Consent given by:  Patient and parent   Risks, benefits, and alternatives were discussed: yes     Risks discussed:  Bleeding, infection, pain, headache, nerve damage and repeat procedure   Alternatives discussed:  No treatment and delayed treatment Universal protocol:    Procedure explained and questions answered to patient or proxy's satisfaction: yes     Site/side marked: yes     Patient identity confirmed:  Verbally with patient and arm band Pre-procedure details:    Procedure purpose:  Diagnostic   Preparation: Patient was prepped and draped in usual sterile fashion   Anesthesia:    Anesthesia method:  Local infiltration   Local anesthetic:  Lidocaine 1% w/o epi Procedure details:    Lumbar space:  L3-L4 interspace   Patient position:  L lateral decubitus   Ultrasound guidance: no     Number of attempts:  3 Post-procedure details:    Puncture site:  Adhesive bandage applied   Procedure completion:  Procedure terminated electively by provider Comments:     Unable to obtain sample     Sloan Leiter, DO 09/13/22 0015

## 2022-09-14 DIAGNOSIS — G932 Benign intracranial hypertension: Principal | ICD-10-CM

## 2022-09-14 DIAGNOSIS — H547 Unspecified visual loss: Secondary | ICD-10-CM | POA: Diagnosis not present

## 2022-09-14 DIAGNOSIS — H4710 Unspecified papilledema: Secondary | ICD-10-CM | POA: Diagnosis not present

## 2022-09-14 LAB — BASIC METABOLIC PANEL
Anion gap: 7 (ref 5–15)
BUN: 9 mg/dL (ref 6–20)
CO2: 19 mmol/L — ABNORMAL LOW (ref 22–32)
Calcium: 8.9 mg/dL (ref 8.9–10.3)
Chloride: 106 mmol/L (ref 98–111)
Creatinine, Ser: 0.92 mg/dL (ref 0.44–1.00)
GFR, Estimated: >60 mL/min (ref >60)
Glucose, Bld: 95 mg/dL (ref 70–99)
Potassium: 4 mmol/L (ref 3.5–5.1)
Sodium: 132 mmol/L — ABNORMAL LOW (ref 135–145)

## 2022-09-14 LAB — CBC
HCT: 36.5 % (ref 36.0–46.0)
Hemoglobin: 12 g/dL (ref 12.0–15.0)
MCH: 27.6 pg (ref 26.0–34.0)
MCHC: 32.9 g/dL (ref 30.0–36.0)
MCV: 83.9 fL (ref 80.0–100.0)
Platelets: 318 10*3/uL (ref 150–400)
RBC: 4.35 MIL/uL (ref 3.87–5.11)
RDW: 14 % (ref 11.5–15.5)
WBC: 9.7 10*3/uL (ref 4.0–10.5)
nRBC: 0 % (ref 0.0–0.2)

## 2022-09-14 LAB — MAGNESIUM: Magnesium: 2.3 mg/dL (ref 1.7–2.4)

## 2022-09-14 LAB — VDRL, CSF: VDRL Quant, CSF: NONREACTIVE

## 2022-09-14 MED ORDER — ONDANSETRON HCL 4 MG PO TABS: 4.0000 mg | ORAL_TABLET | Freq: Four times a day (QID) | ORAL | 0 refills | Status: AC | PRN

## 2022-09-14 MED ORDER — ACETAZOLAMIDE 250 MG PO TABS: ORAL_TABLET | ORAL | 0 refills | Status: DC

## 2022-09-14 MED ORDER — ACETAMINOPHEN 325 MG PO TABS: 650.0000 mg | ORAL_TABLET | Freq: Four times a day (QID) | ORAL | 0 refills | Status: AC | PRN

## 2022-09-14 NOTE — Discharge Summary (Signed)
Physician Discharge Summary  Monica [name] ZOX:[id number] DOB: 07-22-2000 DOA: 09/12/2022  PCP: [facility]  Admit date: 09/12/2022 Discharge date: 09/14/2022  Admitted From: Home  Discharge disposition: Home  Recommendations for Outpatient Follow-Up:   Follow up with your primary care provider in one week.  Follow-up with ophthalmology as has been scheduled coming Tuesday on 09/19/2022. Will need regular visual field testing. Patient has been prescribed Diamox 250 mg twice daily for now and will be increased in a week to 500 mg twice daily.   Outpatient referral to [facility] neurology has been placed in.  Office to schedule an appointment.     Discharge Diagnosis:   Principal Problem:   Papilledema of both eyes Active Problems:   Vision loss   Benign intracranial hypertension  Discharge Condition: Stable  Diet recommendation: Regular  Wound care: None.  Code status: Full.   History of Present Illness:   Monica [name] is a 22 y.o. female with obesity presented to the ED with loss of vision in the right eye for 4 days which was painless.  Patient was referred to ophthalmology and on examination had bilateral papilledema and visual field deficit in the right eye so was sent back to the ED for further evaluation including MRI and lumbar puncture.   Hospital Course:   Following conditions were addressed during hospitalization as listed below,  Papilledema of both eyes secondary to benign intracranial hypertension. Papilledema of both eyes with R eye visual field on ophthalmological examination.  MR brain and MR orbits with and without contrast was negative.  Suspected idiopathic intracranial hypertension.  ED provider was unable to get lumbar puncture.  IR was consulted and patient underwent fluoroscopy guided lumbar puncture with opening pressure of 30 cm of water.  Communicated with Dr. Otelia Limes neurology who recommended Diamox to 250 twice  daily and increase to 500 twice daily as tolerated on discharge with regular ophthalmology follow-up for visual field monitoring.  Neurology also recommended outpatient neurology follow-up on discharge.     Mild hypokalemia.  Was replenished with oral potassium.  Potassium prior to discharge was 4.0.   Obesity.  There is no height or weight on file to calculate BMI.  Would benefit from lifestyle modification.  Disposition.  At this time, patient is stable for disposition home with outpatient PCP ophthalmology and neurology follow-up.  Medical Consultants:   Neurology  Procedures:    MRI of the brain, MRI orbits,  Fluoroscopic guided lumbar puncture on 09/13/2022  Subjective:   Today, patient was seen and examined at bedside.  Complains of mild headache, still with visual blurring and deficit on the right side but not worsening.  Discharge Exam:   Vitals:   09/14/22 0522 09/14/22 0926  BP: (!) 108/57 131/72  Pulse: 75 88  Resp:  17  Temp: 97.7 F (36.5 C) 98.8 F (37.1 C)  SpO2:  100%   Vitals:   09/13/22 1851 09/13/22 2224 09/14/22 0522 09/14/22 0926  BP: (!) 95/46 (!) 113/58 (!) 108/57 131/72  Pulse: 71 72 75 88  Resp: 19 20  17   Temp: 97.6 F (36.4 C) 98.1 F (36.7 C) 97.7 F (36.5 C) 98.8 F (37.1 C)  TempSrc: Oral Oral Oral Oral  SpO2: 100% 100%  100%    General: Alert awake, not in obvious distress, obese built, HENT: No scleral pallor or icterus noted. Oral mucosa is moist.  Chest:  Clear breath sounds.  Diminished breath sounds bilaterally. No  crackles or wheezes.  CVS: S1 &S2 heard. No murmur.  Regular rate and rhythm. Abdomen: Soft, nontender, nondistended.  Bowel sounds are heard.   Extremities: No cyanosis, clubbing or edema.  Peripheral pulses are palpable. Psych: Alert, awake and oriented, normal mood CNS: Right visual deficit.Marland Kitchen  Power equal in all extremities.   Skin: Warm and dry.  No rashes noted.  The results of significant diagnostics from  this hospitalization (including imaging, microbiology, ancillary and laboratory) are listed below for reference.     Diagnostic Studies:   DG FL GUIDED LUMBAR PUNCTURE  Result Date: 09/13/2022 CLINICAL DATA:  22 year old with several day complaint of right eye blindness. Idiopathic intracranial hypertension suspected. Diagnostic and therapeutic lumbar puncture requested. EXAM: LUMBAR PUNCTURE UNDER FLUOROSCOPY PROCEDURE: An appropriate skin entry site was determined fluoroscopically. Operator donned sterile gloves and mask. Skin site was marked, then prepped with Betadine, draped in usual sterile fashion, and infiltrated locally with 1% lidocaine. A 20 gauge spinal needle advanced into the thecal sac at L4-L5 from a right interlaminar approach. Clear colorless CSF spontaneously returned, with opening pressure of 30 cm water. 22 ml CSF were collected and divided among 4 sterile vials for the requested laboratory studies. The needle was then removed. The patient tolerated the procedure well and there were no complications. FLUOROSCOPY: Radiation Exposure Index (as provided by the fluoroscopic device): 6.5 mGy Kerma IMPRESSION: Technically successful lumbar puncture under fluoroscopic guidance. This exam was performed by Loyce Dys PA-C, and was supervised and interpreted by Myles Rosenthal, MD. Electronically Signed   By: Danae Orleans M.D.   On: 09/13/2022 14:54   MR Brain W and Wo Contrast  Result Date: 09/12/2022 CLINICAL DATA:  Blurry vision in the right eye, bilateral disc edema and visual field defect in the right eye. EXAM: MRI HEAD AND ORBITS WITHOUT AND WITH CONTRAST TECHNIQUE: Multiplanar, multiecho pulse sequences of the brain and surrounding structures were obtained without and with intravenous contrast. Multiplanar, multiecho pulse sequences of the orbits and surrounding structures were obtained including fat saturation techniques, before and after intravenous contrast administration. CONTRAST:   10mL GADAVIST GADOBUTROL 1 MMOL/ML IV SOLN COMPARISON:  Same-day CT head. FINDINGS: MRI HEAD FINDINGS Brain: There is no acute intracranial hemorrhage, extra-axial fluid collection, or acute infarct. Parenchymal volume is normal. The ventricles are normal in size. Parenchymal signal is normal. The pituitary and suprasellar region are normal. There is no mass lesion or abnormal enhancement. There is no mass effect or midline shift. Vascular: Normal flow voids. Skull and upper cervical spine: Normal marrow signal. Other: None. MRI ORBITS FINDINGS Orbits: Right: The globe is normal. The orbital fat is normal. The extraocular muscles are normal. The optic nerve is normal. The lacrimal gland is normal. There is no abnormal enhancement. Left: The globe is normal. The orbital fat is normal. The extraocular muscles are normal. The optic nerve is normal. The lacrimal gland is normal. There is no abnormal enhancement. The optic chiasm is normal. Visualized sinuses: There is mild mucosal thickening in the paranasal sinuses. Soft tissues: Unremarkable. IMPRESSION: Unremarkable MRI of the brain and orbits with no acute finding. Electronically Signed   By: Lesia Hausen M.D.   On: 09/12/2022 19:58   MR ORBITS W WO CONTRAST  Result Date: 09/12/2022 CLINICAL DATA:  Blurry vision in the right eye, bilateral disc edema and visual field defect in the right eye. EXAM: MRI HEAD AND ORBITS WITHOUT AND WITH CONTRAST TECHNIQUE: Multiplanar, multiecho pulse sequences of the brain and  surrounding structures were obtained without and with intravenous contrast. Multiplanar, multiecho pulse sequences of the orbits and surrounding structures were obtained including fat saturation techniques, before and after intravenous contrast administration. CONTRAST:  10mL GADAVIST GADOBUTROL 1 MMOL/ML IV SOLN COMPARISON:  Same-day CT head. FINDINGS: MRI HEAD FINDINGS Brain: There is no acute intracranial hemorrhage, extra-axial fluid collection, or acute  infarct. Parenchymal volume is normal. The ventricles are normal in size. Parenchymal signal is normal. The pituitary and suprasellar region are normal. There is no mass lesion or abnormal enhancement. There is no mass effect or midline shift. Vascular: Normal flow voids. Skull and upper cervical spine: Normal marrow signal. Other: None. MRI ORBITS FINDINGS Orbits: Right: The globe is normal. The orbital fat is normal. The extraocular muscles are normal. The optic nerve is normal. The lacrimal gland is normal. There is no abnormal enhancement. Left: The globe is normal. The orbital fat is normal. The extraocular muscles are normal. The optic nerve is normal. The lacrimal gland is normal. There is no abnormal enhancement. The optic chiasm is normal. Visualized sinuses: There is mild mucosal thickening in the paranasal sinuses. Soft tissues: Unremarkable. IMPRESSION: Unremarkable MRI of the brain and orbits with no acute finding. Electronically Signed   By: Lesia Hausen M.D.   On: 09/12/2022 19:58   CT Head Wo Contrast  Result Date: 09/12/2022 CLINICAL DATA:  Vision changes in the right eye for 4 days EXAM: CT HEAD WITHOUT CONTRAST TECHNIQUE: Contiguous axial images were obtained from the base of the skull through the vertex without intravenous contrast. RADIATION DOSE REDUCTION: This exam was performed according to the departmental dose-optimization program which includes automated exposure control, adjustment of the mA and/or kV according to patient size and/or use of iterative reconstruction technique. COMPARISON:  None Available. FINDINGS: Brain: No evidence of acute infarction, hemorrhage, hydrocephalus, extra-axial collection or mass lesion/mass effect. Vascular: No hyperdense vessel or unexpected calcification. Skull: Normal. Negative for fracture or focal lesion. Sinuses/Orbits: Negative IMPRESSION: Normal head CT. Electronically Signed   By: Tiburcio Pea M.D.   On: 09/12/2022 06:11     Labs:    Basic Metabolic Panel: Recent Labs  Lab 09/12/22 1601 09/14/22 0028  NA 135 132*  K 3.3* 4.0  CL 106 106  CO2 22 19*  GLUCOSE 109* 95  BUN 7 9  CREATININE 0.75 0.92  CALCIUM 8.6* 8.9  MG  --  2.3   GFR Estimated Creatinine Clearance: 129.8 mL/min (by C-G formula based on SCr of 0.92 mg/dL). Liver Function Tests: No results for input(s): "AST", "ALT", "ALKPHOS", "BILITOT", "PROT", "ALBUMIN" in the last 168 hours. No results for input(s): "LIPASE", "AMYLASE" in the last 168 hours. No results for input(s): "AMMONIA" in the last 168 hours. Coagulation profile No results for input(s): "INR", "PROTIME" in the last 168 hours.  CBC: Recent Labs  Lab 09/12/22 1601 09/14/22 0028  WBC 10.4 9.7  HGB 12.5 12.0  HCT 36.9 36.5  MCV 84.6 83.9  PLT 326 318   Cardiac Enzymes: No results for input(s): "CKTOTAL", "CKMB", "CKMBINDEX", "TROPONINI" in the last 168 hours. BNP: Invalid input(s): "POCBNP" CBG: No results for input(s): "GLUCAP" in the last 168 hours. D-Dimer No results for input(s): "DDIMER" in the last 72 hours. Hgb A1c No results for input(s): "HGBA1C" in the last 72 hours. Lipid Profile No results for input(s): "CHOL", "HDL", "LDLCALC", "TRIG", "CHOLHDL", "LDLDIRECT" in the last 72 hours. Thyroid function studies No results for input(s): "TSH", "T4TOTAL", "T3FREE", "THYROIDAB" in the last 72 hours.  Invalid input(s): "FREET3" Anemia work up No results for input(s): "VITAMINB12", "FOLATE", "FERRITIN", "TIBC", "IRON", "RETICCTPCT" in the last 72 hours. Microbiology Recent Results (from the past 240 hour(s))  CSF culture w Gram Stain     Status: None (Preliminary result)   Collection Time: 09/13/22  2:33 PM   Specimen: PATH Cytology CSF; Cerebrospinal Fluid  Result Value Ref Range Status   Specimen Description CSF  Final   Special Requests NONE  Final   Gram Stain NO WBC SEEN NO ORGANISMS SEEN CYTOSPIN SMEAR   Final   Culture   Final    NO GROWTH < 24  HOURS Performed at [facility], [address] [place], [place] [address]    Report Status PENDING  Incomplete     Discharge Instructions:   Discharge Instructions     Ambulatory referral to Neurology   Complete by: As directed    An appointment is requested in approximately: 2 weeks   Diet - low sodium heart healthy   Complete by: As directed    Discharge instructions   Complete by: As directed    Follow up with your primary care provider in one week. Increase diamox to 500 mg twice a day in one week if without side effects. You will need to follow-up with neurology as outpatient (outpatient referral has been [facility] to [facility]).  Please follow-up with your ophthalmology provider within 1 week for rechecking the response to treatment and further recommendation..   Increase activity slowly   Complete by: As directed       Allergies as of 09/14/2022       Reactions   Soy Allergy Hives        Medication List     STOP taking these medications    ibuprofen 600 MG tablet Commonly known as: ADVIL       TAKE these medications    acetaminophen 325 MG tablet Commonly known as: TYLENOL Take 2 tablets (650 mg total) by mouth every 6 (six) hours as needed for up to 10 days for mild pain or headache (or Fever >/= 101).   acetaZOLAMIDE 250 MG tablet Commonly known as: DIAMOX Take 1 tablet (250 mg total) by mouth 2 (two) times daily for 7 days, THEN 2 tablets (500 mg total) 2 (two) times daily for 21 days. Start taking on: Sep 14, 2022   albuterol (2.5 MG/3ML) 0.083% nebulizer solution Commonly known as: PROVENTIL Take 3 mLs (2.5 mg total) by nebulization every 4 (four) hours as needed for wheezing or shortness of breath (coughing fits). What changed: Another medication with the same name was changed. Make sure you understand how and when to take each.   Ventolin HFA 108 (90 Base) MCG/ACT inhaler Generic drug: albuterol INHALE 2 PUFFS INTO THE LUNGS EVERY  4 (FOUR) HOURS AS NEEDED FOR WHEEZING OR SHORTNESS OF BREATH (COUGHING FITS). What changed: See the new instructions.   cetirizine 10 MG tablet Commonly known as: ZyrTEC Allergy Take 1 tablet (10 mg total) by mouth 2 (two) times daily. What changed:  when to take this reasons to take this   EPINEPHrine 0.3 mg/0.3 mL Soaj injection Commonly known as: EPI-PEN Inject 0.3 mg into the muscle as needed for anaphylaxis.   ondansetron 4 MG tablet Commonly known as: ZOFRAN Take 1 tablet (4 mg total) by mouth every 6 (six) hours as needed for nausea.   triamcinolone cream 0.1 % Commonly known as: KENALOG Apply 1 Application topically daily as needed (for  hives).        Time coordinating discharge: 39 minutes  Signed:  Mats Jeanlouis  [facility] 09/14/2022, 1:37 PM

## 2022-09-14 NOTE — Plan of Care (Signed)
Adequate for discharge.

## 2022-09-14 NOTE — Plan of Care (Signed)
  Problem: Education: Goal: Knowledge of General Education information will improve Description: Including pain rating scale, medication(s)/side effects and non-pharmacologic comfort measures Outcome: Progressing   Problem: Health Behavior/Discharge Planning: Goal: Ability to manage health-related needs will improve Outcome: Progressing   Problem: Clinical Measurements: Goal: Respiratory complications will improve Outcome: Progressing Goal: Cardiovascular complication will be avoided Outcome: Progressing   Problem: Activity: Goal: Risk for activity intolerance will decrease Outcome: Progressing   Problem: Nutrition: Goal: Adequate nutrition will be maintained Outcome: Progressing   Problem: Elimination: Goal: Will not experience complications related to bowel motility Outcome: Progressing   Problem: Pain Managment: Goal: General experience of comfort will improve Outcome: Progressing   Problem: Safety: Goal: Ability to remain free from injury will improve Outcome: Progressing

## 2022-09-15 LAB — CSF CULTURE W GRAM STAIN: Gram Stain: NONE SEEN

## 2022-09-15 LAB — CYTOLOGY - NON PAP

## 2022-09-16 ENCOUNTER — Telehealth: Payer: Self-pay | Admitting: Neurology

## 2022-09-16 LAB — CSF CULTURE W GRAM STAIN: Culture: NO GROWTH

## 2022-09-16 NOTE — Telephone Encounter (Signed)
Patient recently diagnosed with IIH, discharge from the hospital on 5/30, on Diamox and refer to [facility]. Her appointment has not been set up yet. She called the on call service, complaining of side effect of the diamox including metallic taste and paresthesia. She is currently on Diamox 250mg  BID. Advised her to decrease to half dose 125 mg BID until seen by [facility] (appoinment 6/4) or by [facility].   Dr. Teresa Coombs

## 2022-09-19 LAB — OLIGOCLONAL BANDS, CSF + SERM

## 2022-09-25 ENCOUNTER — Ambulatory Visit (INDEPENDENT_AMBULATORY_CARE_PROVIDER_SITE_OTHER): Payer: Medicaid Other | Admitting: Diagnostic Neuroimaging

## 2022-09-25 ENCOUNTER — Encounter: Payer: Self-pay | Admitting: Diagnostic Neuroimaging

## 2022-09-25 VITALS — BP 113/66 | HR 90 | Ht 65.0 in | Wt 270.4 lb

## 2022-09-25 DIAGNOSIS — G932 Benign intracranial hypertension: Secondary | ICD-10-CM

## 2022-09-25 MED ORDER — ACETAZOLAMIDE 250 MG PO TABS: 250.0000 mg | ORAL_TABLET | Freq: Two times a day (BID) | ORAL | 12 refills | Status: DC

## 2022-09-25 NOTE — Progress Notes (Signed)
[facility]  PATIENT: Monica [name] DOB: 11-19-2000  REFERRING CLINICIAN: Joycelyn Das, MD HISTORY FROM: patient  REASON FOR VISIT: new consult    HISTORICAL  CHIEF COMPLAINT:  Chief Complaint  Patient presents with   New Patient (Initial Visit)    Rm 7, alone Follow up from ER for loss of vision on right eye. Had last eye exam on 09/22/2022 was told eye seemed "worst" and to continue the Diamox. Pt reports no changes since ER visit.     HISTORY OF PRESENT ILLNESS:   22 year old female here for evaluation of idiopathic intracranial hypertension.  3 weeks ago patient had onset of right eye vision loss.  She was laying down with her left eye closed and noticed that her right eye was not able to see properly.  She went to sleep and woke up the next day with persistent symptoms.  She went to ER for evaluation.  She was referred to ophthalmology who referred patient back to ER for emergent evaluation due to bilateral papilledema.  MRI brain, MRI orbits obtained and unremarkable.  Had lumbar puncture demonstrating opening pressure elevation of 30 cmH2O.  Started on acetazolamide.  Has been on this for the last 1-1/2 weeks.  Symptoms are stable.  No improvement or worsening.  Has noted some dull headaches since ER evaluation.  Patient has had 20 to 30 pound weight gain over the past year.  He is planning to see weight management clinic tomorrow.  Has follow-up eye exam later this week.    REVIEW OF SYSTEMS: Full 14 system review of systems performed and negative with exception of: as per HPI.  ALLERGIES: Allergies  Allergen Reactions   Soy Allergy Hives    HOME MEDICATIONS: Outpatient Medications Prior to Visit  Medication Sig Dispense Refill   albuterol (PROVENTIL) (2.5 MG/3ML) 0.083% nebulizer solution Take 3 mLs (2.5 mg total) by nebulization every 4 (four) hours as needed for wheezing or shortness of breath (coughing fits). 75 mL 1   cetirizine (ZYRTEC  ALLERGY) 10 MG tablet Take 1 tablet (10 mg total) by mouth 2 (two) times daily. (Patient taking differently: Take 10 mg by mouth 2 (two) times daily as needed for allergies.) 60 tablet 2   EPINEPHrine 0.3 mg/0.3 mL IJ SOAJ injection Inject 0.3 mg into the muscle as needed for anaphylaxis. 1 each 0   ondansetron (ZOFRAN) 4 MG tablet Take 1 tablet (4 mg total) by mouth every 6 (six) hours as needed for nausea. 20 tablet 0   triamcinolone cream (KENALOG) 0.1 % Apply 1 Application topically daily as needed (for hives).     VENTOLIN HFA 108 (90 Base) MCG/ACT inhaler INHALE 2 PUFFS INTO THE LUNGS EVERY 4 (FOUR) HOURS AS NEEDED FOR WHEEZING OR SHORTNESS OF BREATH (COUGHING FITS). (Patient taking differently: Inhale 2 puffs into the lungs every 4 (four) hours as needed for wheezing or shortness of breath.) 18 each 1   acetaZOLAMIDE (DIAMOX) 250 MG tablet Take 1 tablet (250 mg total) by mouth 2 (two) times daily for 7 days, THEN 2 tablets (500 mg total) 2 (two) times daily for 21 days. 98 tablet 0   No facility-administered medications prior to visit.    PAST MEDICAL HISTORY: Past Medical History:  Diagnosis Date   Asthma    Eczema    Urticaria 04/05/2021    PAST SURGICAL HISTORY: Past Surgical History:  Procedure Laterality Date   ORIF ANKLE FRACTURE Right 09/03/2018   Procedure: RIGHT OPEN REDUCTION INTERNAL FIXATION (  ORIF) ANKLE FRACTURE;  Surgeon: Cammy Copa, MD;  Location: [facility] OR;  Service: Orthopedics;  Laterality: Right;   WISDOM TOOTH EXTRACTION  11/2016    FAMILY HISTORY: Family History  Problem Relation Age of Onset   Asthma Mother    Asthma Father    Eczema Sister    Asthma Brother    Allergic rhinitis Neg Hx    Angioedema Neg Hx    Immunodeficiency Neg Hx    Urticaria Neg Hx     SOCIAL HISTORY: Social History   Socioeconomic History   Marital status: Single    Spouse name: Not on file   Number of children: Not on file   Years of education: Not on file    Highest education level: Not on file  Occupational History   Not on file  Tobacco Use   Smoking status: Never   Smokeless tobacco: Never  Vaping Use   Vaping Use: Never used  Substance and Sexual Activity   Alcohol use: No   Drug use: No    Types: Marijuana    Comment: Stopped 3 months ago   Sexual activity: Never  Other Topics Concern   Not on file  Social History Narrative   Not on file   Social Determinants of Health   Financial Resource Strain: Not on file  Food Insecurity: Not on file  Transportation Needs: Not on file  Physical Activity: Not on file  Stress: Not on file  Social Connections: Not on file  Intimate Partner Violence: Not on file     PHYSICAL EXAM  GENERAL EXAM/CONSTITUTIONAL: Vitals:  Vitals:   09/25/22 1103  BP: 113/66  Pulse: 90  Weight: 270 lb 6.4 oz (122.7 kg)  Height: 5\' 5"  (1.651 m)   Body mass index is 45 kg/m. Wt Readings from Last 3 Encounters:  09/25/22 270 lb 6.4 oz (122.7 kg)  09/12/22 280 lb (127 kg)  05/15/22 260 lb (117.9 kg)   Patient is in no distress; well developed, nourished and groomed; neck is supple  CARDIOVASCULAR: Examination of carotid arteries is normal; no carotid bruits Regular rate and rhythm, no murmurs Examination of peripheral vascular system by observation and palpation is normal  EYES: Ophthalmoscopic exam of optic discs and posterior segments is normal; no papilledema or hemorrhages No results found.  MUSCULOSKELETAL: Gait, strength, tone, movements noted in Neurologic exam below  NEUROLOGIC: MENTAL STATUS:      No data to display         awake, alert, oriented to person, place and time recent and remote memory intact normal attention and concentration language fluent, comprehension intact, naming intact fund of knowledge appropriate  CRANIAL NERVE:  2nd - BILATERAL PAPILLEDEMA; ABLE TO COUNT FINGERS IN RIGHT EYE, BUT VERY BLURRED AND DECR BRIGHTNESS 2nd, 3rd, 4th, 6th - LEFT PUPIL  REACTIVE; RIGHT EYE RELATIVE AFFERENT PUPILLARY DEFECT; visual fields full to confrontation, extraocular muscles intact, no nystagmus 5th - facial sensation symmetric 7th - facial strength symmetric 8th - hearing intact 9th - palate elevates symmetrically, uvula midline 11th - shoulder shrug symmetric 12th - tongue protrusion midline  MOTOR:  normal bulk and tone, full strength in the BUE, BLE  SENSORY:  normal and symmetric to light touch, pinprick, temperature, vibration  COORDINATION:  finger-nose-finger, fine finger movements normal  REFLEXES:  deep tendon reflexes TRACE and symmetric  GAIT/STATION:  narrow based gait     DIAGNOSTIC DATA (LABS, IMAGING, TESTING) - I reviewed patient records, labs, notes, testing and imaging myself  where available.  Lab Results  Component Value Date   WBC 9.7 09/14/2022   HGB 12.0 09/14/2022   HCT 36.5 09/14/2022   MCV 83.9 09/14/2022   PLT 318 09/14/2022      Component Value Date/Time   NA 132 (L) 09/14/2022 0028   NA 142 04/05/2021 1624   K 4.0 09/14/2022 0028   CL 106 09/14/2022 0028   CO2 19 (L) 09/14/2022 0028   GLUCOSE 95 09/14/2022 0028   BUN 9 09/14/2022 0028   BUN 11 04/05/2021 1624   CREATININE 0.92 09/14/2022 0028   CALCIUM 8.9 09/14/2022 0028   PROT 6.1 04/05/2021 1624   ALBUMIN 3.8 (L) 04/05/2021 1624   AST 26 04/05/2021 1624   ALT 34 (H) 04/05/2021 1624   ALKPHOS 93 04/05/2021 1624   BILITOT 0.2 04/05/2021 1624   GFRNONAA >60 09/14/2022 0028   GFRAA >60 11/28/2019 2108   Lab Results  Component Value Date   CHOL 127 02/09/2017   HDL 32 (L) 02/09/2017   LDLCALC 67 02/09/2017   TRIG 140 02/09/2017   CHOLHDL 4.0 02/09/2017   No results found for: "HGBA1C" No results found for: "VITAMINB12" Lab Results  Component Value Date   TSH 2.978 02/09/2017     09/12/22 Unremarkable MRI of the brain and orbits with no acute finding.  09/13/22 LP Clear colorless CSF spontaneously returned, with opening  pressure of 30 cm water. Technically successful lumbar puncture under fluoroscopic guidance.     ASSESSMENT AND PLAN  22 y.o. year old female here with:   Dx:  1. IIH (idiopathic intracranial hypertension)     PLAN:  idiopathic intracranial hypertension (pseudotumor cerebri)  - continue acetazolamide 250mg  twice a day; increase up to 500mg  twice a day  - follow up with weight mgmt clinic (appt tomorrow) - follow up with eye clinic next week (Dr. Cathey Endow) - then may consider repeat therapeutic lumbar puncture  Meds ordered this encounter  Medications   acetaZOLAMIDE (DIAMOX) 250 MG tablet    Sig: Take 1-2 tablets (250-500 mg total) by mouth 2 (two) times daily.    Dispense:  120 tablet    Refill:  12   Return in about 3 months (around 12/26/2022).  I reviewed images, labs, notes, records myself. I summarized findings and reviewed with patient, for this high risk condition (vision loss, papilledema) requiring high complexity decision making.    Suanne Marker, MD 09/25/2022, 11:44 AM Certified in Neurology, Neurophysiology and Neuroimaging  [facility] [address] [place], [place] [address] [phone]

## 2022-09-25 NOTE — Patient Instructions (Signed)
  idiopathic intracranial hypertension (pseudotumor cerebri)  - continue acetazolamide 250mg  twice a day; increase up to 500mg  twice a day  - follow up with weight mgmt clinic (appt tomorrow) - follow up with eye clinic next week (Dr. Cathey Endow)

## 2022-10-17 ENCOUNTER — Telehealth: Payer: Self-pay | Admitting: Neurology

## 2022-10-17 MED ORDER — ACETAZOLAMIDE 250 MG PO TABS: 500.0000 mg | ORAL_TABLET | Freq: Three times a day (TID) | ORAL | 4 refills | Status: DC

## 2022-10-17 NOTE — Telephone Encounter (Signed)
Attmpted to call the patient and it went straight to VM. Called the pt mother number that was on file. Attempted to call the mother number that is listed and it states the number is not in service.  Dr Marjory Lies has increased the patient Diamox to 250 mg TID from 250 mg BID. He has forwarded this to the pharmacy for the patient.

## 2022-10-17 NOTE — Telephone Encounter (Signed)
Meds ordered this encounter  Medications   acetaZOLAMIDE (DIAMOX) 250 MG tablet    Sig: Take 2 tablets (500 mg total) by mouth 3 (three) times daily.    Dispense:  540 tablet    Refill:  4   Suanne Marker, MD 10/17/2022, 10:05 AM Certified in Neurology, Neurophysiology and Neuroimaging  [facility] [address] [place], [place] [address] [phone]

## 2022-10-17 NOTE — Telephone Encounter (Signed)
Received a call from Dr. Sinda Du at [facility].  He has evaluated the patient today for IIH. Pt was started on Diamox 500 mg BID. He states upon exam the optic nerve scan looks better but he completed visual field exam today and that is slightly worsened.  Dr Cathey Endow was stating that Dr Marjory Lies was wanting this exam completed to see if the 500 mg Diamox BID was helping the patient. He feels it is helping but considering that the visual field exam is still off, he thinks that pt may benefit from increase to Diamox 500 mg TID. He wanted to bring this to Dr [facility] attention and let him decide if he agrees and make medication change if so, I will forward this information to Dr Marjory Lies and if agree, will contact patient and make change if so. Dr Cathey Endow states if Dr Marjory Lies needed him he could reach out to him as well.

## 2022-10-17 NOTE — Addendum Note (Signed)
Addended by: Joycelyn Schmid R on: 10/17/2022 10:05 AM   Modules accepted: Orders

## 2023-01-02 ENCOUNTER — Ambulatory Visit: Payer: Medicaid Other | Admitting: Diagnostic Neuroimaging

## 2023-01-02 ENCOUNTER — Encounter: Payer: Self-pay | Admitting: Diagnostic Neuroimaging

## 2023-02-21 ENCOUNTER — Telehealth: Payer: Self-pay | Admitting: Diagnostic Neuroimaging

## 2023-02-21 NOTE — Telephone Encounter (Signed)
Patient called asking for a letter for work stating that she is able to drive with her IIH. 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 She reached out to her ophthalmologist and her doctor was not able to get one to her quickly.

## 2023-02-22 NOTE — Telephone Encounter (Signed)
Contacted pt back, LVM rq call back

## 2023-05-24 ENCOUNTER — Emergency Department (HOSPITAL_COMMUNITY): Payer: Worker's Compensation

## 2023-05-24 ENCOUNTER — Emergency Department (HOSPITAL_COMMUNITY)
Admission: EM | Admit: 2023-05-24 | Discharge: 2023-05-24 | Disposition: A | Discharge status: Home / Self Care | Payer: Worker's Compensation | Attending: Emergency Medicine | Admitting: Emergency Medicine

## 2023-05-24 ENCOUNTER — Other Ambulatory Visit: Payer: Self-pay

## 2023-05-24 ENCOUNTER — Encounter (HOSPITAL_COMMUNITY): Payer: Self-pay | Admitting: *Deleted

## 2023-05-24 DIAGNOSIS — Y9269 Other specified industrial and construction area as the place of occurrence of the external cause: Secondary | ICD-10-CM | POA: Diagnosis not present

## 2023-05-24 DIAGNOSIS — M25571 Pain in right ankle and joints of right foot: Secondary | ICD-10-CM | POA: Diagnosis not present

## 2023-05-24 DIAGNOSIS — M545 Low back pain, unspecified: Secondary | ICD-10-CM | POA: Insufficient documentation

## 2023-05-24 DIAGNOSIS — Y990 Civilian activity done for income or pay: Secondary | ICD-10-CM | POA: Diagnosis not present

## 2023-05-24 NOTE — ED Provider Notes (Signed)
 [facility] Provider Note   CSN: [id number] Arrival date & time: 05/24/23  1029     History Chief Complaint  Patient presents with   W/C injury   Back Pain   Ankle Pain    Monica [name] is a 23 y.o. female.   Back Pain Ankle Pain Associated symptoms: back pain    Pt presents to the ED after being hit by the forks of a forklift 3 hours ago. Hx of IIH. Hit her ankles and landed on buttocks and hit her head on the forklift, landing on the forklift. She then fell off the forklift, she hit her low back and twisted her R ankle. [place] Has ambulated since.   Now complaining of neck pain, low back pain, R ankle pain.   Denies headache, n/v, numbness, tingling, weakness, lacerations.      Home Medications Prior to Admission medications   Medication Sig Start Date End Date Taking? Authorizing Provider  acetaZOLAMIDE  (DIAMOX ) 250 MG tablet Take 2 tablets (500 mg total) by mouth 3 (three) times daily. 10/17/22 01/10/24  Penumalli, Vikram R, MD  albuterol  (PROVENTIL ) (2.5 MG/3ML) 0.083% nebulizer solution Take 3 mLs (2.5 mg total) by nebulization every 4 (four) hours as needed for wheezing or shortness of breath (coughing fits). 04/05/21   Luke Orlan HERO, DO  cetirizine  (ZYRTEC  ALLERGY) 10 MG tablet Take 1 tablet (10 mg total) by mouth 2 (two) times daily. Patient taking differently: Take 10 mg by mouth 2 (two) times daily as needed for allergies. 04/05/21   Luke Orlan HERO, DO  EPINEPHrine  0.3 mg/0.3 mL IJ SOAJ injection Inject 0.3 mg into the muscle as needed for anaphylaxis. 03/18/21   Schuyler Charlie RAMAN, MD  ondansetron  (ZOFRAN ) 4 MG tablet Take 1 tablet (4 mg total) by mouth every 6 (six) hours as needed for nausea. 09/14/22   Pokhrel, Laxman, MD  triamcinolone cream (KENALOG) 0.1 % Apply 1 Application topically daily as needed (for hives). 02/17/21   [provider]  VENTOLIN  HFA 108 (90 Base) MCG/ACT inhaler INHALE 2 PUFFS  INTO THE LUNGS EVERY 4 (FOUR) HOURS AS NEEDED FOR WHEEZING OR SHORTNESS OF BREATH (COUGHING FITS). Patient taking differently: Inhale 2 puffs into the lungs every 4 (four) hours as needed for wheezing or shortness of breath. 10/25/21   Luke Orlan HERO, DO      Allergies    Soy allergy (obsolete)    Review of Systems   Review of Systems  Musculoskeletal:  Positive for arthralgias and back pain.  All other systems reviewed and are negative.   Physical Exam Updated Vital Signs BP 133/78 (BP Location: Left Arm)   Pulse 90   Temp 98 F (36.7 C) (Oral)   Resp 18   Ht 5' 6 (1.676 m)   Wt 113.4 kg   LMP 05/10/2023   SpO2 100%   BMI 40.35 kg/m  Physical Exam Vitals and nursing note reviewed.  Constitutional:      General: She is not in acute distress.    Appearance: Normal appearance. She is not ill-appearing.  HENT:     Head: Normocephalic and atraumatic.  Eyes:     Extraocular Movements: Extraocular movements intact.     Conjunctiva/sclera: Conjunctivae normal.  Neck:     Comments: Left-sided paraspinal tenderness noted along the trapezius muscle.  No midline tenderness.  Full ROM Cardiovascular:     Rate and Rhythm: Normal rate and regular rhythm.  Pulses: Normal pulses.     Heart sounds: Normal heart sounds. No murmur heard.    No friction rub. No gallop.  Pulmonary:     Effort: Pulmonary effort is normal. No respiratory distress.     Breath sounds: Normal breath sounds.  Abdominal:     General: Abdomen is flat.     Palpations: Abdomen is soft.     Tenderness: There is no abdominal tenderness.  Musculoskeletal:        General: Tenderness and signs of injury (Small abrasion noted to the lateral ankle malleolus) present. No swelling or deformity.     Cervical back: Normal range of motion. No rigidity or tenderness.     Right lower leg: No edema.     Left lower leg: No edema.  Skin:    General: Skin is warm and dry.     Findings: No bruising or erythema.   Neurological:     General: No focal deficit present.     Mental Status: She is alert and oriented to person, place, and time. Mental status is at baseline.     Sensory: No sensory deficit.     Motor: No weakness.     Gait: Gait normal.  Psychiatric:        Mood and Affect: Mood normal.     ED Results / Procedures / Treatments   Labs (all labs ordered are listed, but only abnormal results are displayed) Labs Reviewed - No data to display  EKG None  Radiology DG Ankle Complete Right Result Date: 05/24/2023 CLINICAL DATA:  Hit in ankles by forklift today. History of right ankle surgery in 2020. EXAM: RIGHT ANKLE - COMPLETE 3+ VIEW COMPARISON:  Right ankle radiographs 09/25/2018 FINDINGS: Redemonstration of two screws traversing the medial malleolus. Interval healing of the previously seen medial malleolar fracture. Redemonstration of distal plate and screw fixation of the distal fibula. Interval healing of the prior distal fibular metadiaphyseal fracture. The ankle mortise is symmetric and intact. There is a new chronic well corticated 7 mm ossicle anterior to the talar dome on lateral view. Mild distal anterior tibial plafond degenerative osteophytosis is new from prior. Mild-to-moderate distal medial malleolar degenerative spurring. No acute fracture or dislocation. Interval decrease in soft tissue swelling with possible persistent mild medial malleolar soft tissue swelling. IMPRESSION: 1. No acute fracture or dislocation. 2. Redemonstration of medial and distal fibular ORIF with interval healing of the prior medial malleolar and distal fibular fractures. 3. Mild distal anterior tibial plafond degenerative osteophytosis and a new well corticated ossicle are new from prior. Electronically Signed   By: Tanda Lyons M.D.   On: 05/24/2023 13:52   DG Lumbar Spine 2-3 Views Result Date: 05/24/2023 CLINICAL DATA:  After being hit by forklift at work today. EXAM: LUMBAR SPINE - 2-3 VIEW COMPARISON:   None Available. FINDINGS: There are 5 non-rib-bearing lumbar-type vertebral bodies. Normal frontal alignment. Mild straightening of the normal lumbar lordosis. The lumbar vertebral body heights and intervertebral disc spaces are maintained. Moderate anterior T10-11 disc space narrowing and endplate osteophytosis. No acute fracture or dislocation. IMPRESSION: 1. No acute fracture or dislocation. 2. Moderate T10-11 degenerative disc and endplate changes. The lumbar disc spaces are maintained. Electronically Signed   By: Tanda Lyons M.D.   On: 05/24/2023 13:49    Procedures Procedures    Medications Ordered in ED Medications - No data to display  ED Course/ Medical Decision Making/ A&P  Medical Decision Making Amount and/or Complexity of Data Reviewed Radiology: ordered.   This patient is a 23 year old female who presents to the ED for concern of back pain, right ankle pain post being hit by a forklift.   Differential diagnoses prior to evaluation: The emergent differential diagnosis includes, but is not limited to, fracture, ligamentous injury, muscle strain, TBI. This is not an exhaustive differential.   Past Medical History / Co-morbidities / Social History: Asthma, atopic dermatitis, suicide attempt, MDD, benign intracranial hypertension  Additional history: Chart reviewed. Pertinent results include:   Was seen by neurology on 09/25/2022 for IIH and was prescribed acetazolamide   Lab Tests/Imaging studies: I personally interpreted labs/imaging and the pertinent results include:   X-ray of right ankle is unremarkable showing only changes from previous ORIF. X-ray of lumbar spine showed degenerative changes but no acute abnormalities. .I agree with the radiologist interpretation.    Medications: No occasions were needed for this visit.  I have reviewed the patients home medicines and have made adjustments as needed.  ED Course:  Patient is a  23 year old female presents to the ED after being tripped by a forklift earlier today.  During the event she hit her head, low back, and twisted right ankle.  Patient has ambulated since denies LOC, denies nausea, vomiting, gait abnormality.  Physical exam was unremarkable with mild tenderness noted along the left trapezius muscle of the neck no midline tenderness.  Also notable for left paraspinal muscle tenderness in the lumbar spine and right ankle malleolus tenderness.  No swelling was noted no deformity noted.  No weakness, numbness, tingling.  X-rays were both very reassuring with no signs of fracture or dislocation.  Mild degenerative changes noted to lumbar spine.  Recommended that she continue with Tylenol  and Profen for pain relief.  Patient expressed understanding and agreement with plan.  Provided strict return to ED precautions.  Patient vital signs remained stable to the course of her time here, patient has ambulated without difficulty while being in the ER.  I believe patient stated discharged at this time.   Disposition: After consideration of the diagnostic results and the patients response to treatment, I feel that patient is safe to be discharged and treatment noted as above.   emergency department workup does not suggest an emergent condition requiring admission or immediate intervention beyond what has been performed at this time. The plan is: Symptomatic pain relief, return for any new or worsening symptoms. The patient is safe for discharge and has been instructed to return immediately for worsening symptoms, change in symptoms or any other concerns.   Final Clinical Impression(s) / ED Diagnoses Final diagnoses:  Acute right ankle pain  Acute left-sided low back pain without sciatica    Rx / DC Orders ED Discharge Orders     None         Beola Terrall RAMAN, PA-C 05/24/23 1406    Francesca Elsie CROME, MD 05/24/23 1651

## 2023-05-24 NOTE — ED Triage Notes (Signed)
 Pt was at work today, pt was hit from behind with forklift. Forks hit ankles and lift hit back causing pt to fall, pain in ankles, back and left side. Pt was able to get up by herself.

## 2023-05-24 NOTE — Discharge Instructions (Addendum)
 You were seen today for injuries after being hit by a forklift this morning.  Your x-rays and physical exam are very reassuring with no signs of dislocation or fracture.  Recommend that you rest for today and tomorrow and continue take Tylenol  and ibuprofen  for pain relief  Take Tylenol  (acetominophen)  650mg  every 4-6 hours, as needed for pain or fever. Do not take more than 4,000 mg in a 24-hour period. As this may cause liver damage. While this is rare, if you begin to develop yellowing of the skin or eyes, stop taking and return to ER immediately.  Take Ibuprofen  400mg  every 4-6 hours for pain or fever, not exceeding 3,200 mg per day as more than 3,200mg  can cause Stomach irritation, dizziness, kidney issues with long-term use.  Return to the ED if you begin having any new or worsening symptoms.

## 2023-06-27 DIAGNOSIS — M5412 Radiculopathy, cervical region: Secondary | ICD-10-CM | POA: Insufficient documentation

## 2023-08-18 ENCOUNTER — Ambulatory Visit
Admission: RE | Admit: 2023-08-18 | Discharge: 2023-08-18 | Disposition: A | Discharge status: Home / Self Care | Payer: Self-pay | Source: Ambulatory Visit

## 2023-08-18 ENCOUNTER — Ambulatory Visit
Admission: RE | Admit: 2023-08-18 | Discharge: 2023-08-18 | Disposition: A | Discharge status: Home / Self Care | Source: Ambulatory Visit

## 2023-08-18 ENCOUNTER — Encounter: Payer: Self-pay | Admitting: Emergency Medicine

## 2023-08-18 DIAGNOSIS — M549 Dorsalgia, unspecified: Secondary | ICD-10-CM | POA: Diagnosis not present

## 2023-08-18 NOTE — ED Provider Notes (Signed)
 [name]    CSN: [id number] Arrival date & time: 08/18/23  4098     History   Chief Complaint Chief Complaint  Patient presents with   referral    HPI Monica [name] is a 23 y.o. female.  Patient requests a referral to physical therapy.  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  Originally saw a provider in [facility].  She then went to her primary care who put a physical therapy referral in.  However Worker's Compensation is requesting a referral from a [facility] facility.  Not having any new or worsening symptoms.  No weakness in the extremities.  Has paresthesias when bending forwards   Past Medical History:  Diagnosis Date   Asthma    Eczema    Urticaria 04/05/2021    Patient Active Problem List   Diagnosis Date Noted   Cervical radiculopathy 06/27/2023   Morbid obesity (HCC) 02/19/2023   Benign intracranial hypertension 09/14/2022   Vision loss 09/13/2022   Papilledema of both eyes 09/12/2022   Marijuana user 10/05/2021   Urticaria 04/05/2021   Other allergic rhinitis 04/05/2021   Asthma, not well controlled 04/05/2021   Eczema 11/21/2020   Major depressive disorder, single episode, severe without psychotic features (HCC) 02/08/2017   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      Home Medications    Prior to Admission medications   Medication Sig Start Date End Date  Taking? Authorizing Provider  cyclobenzaprine (FLEXERIL) 5 MG tablet Take 1 tablet twice a day by oral route as needed, for neck pain. 06/27/23  Yes [provider]  norgestimate-ethinyl estradiol (ORTHO-CYCLEN) 0.25-35 MG-MCG tablet Take 1 tablet by mouth daily. 03/07/23  Yes [provider]  acetaZOLAMIDE  (DIAMOX ) 250 MG tablet Take 2 tablets (500 mg total) by mouth 3 (three) times daily. 10/17/22 01/10/24  Penumalli, Vikram R, MD  albuterol  (PROVENTIL ) (2.5 MG/3ML) 0.083% nebulizer solution Take 3 mLs (2.5 mg total) by nebulization every 4 (four) hours as needed for wheezing or shortness of breath (coughing fits). 04/05/21   Trudy Fusi, DO  cetirizine  (ZYRTEC  ALLERGY) 10 MG tablet Take 1 tablet (10 mg total) by mouth 2 (two) times daily. Patient taking differently: Take 10 mg by mouth 2 (two) times daily as needed for allergies. 04/05/21   Trudy Fusi, DO  EPINEPHrine  0.3 mg/0.3 mL IJ SOAJ injection Inject 0.3 mg into the muscle as needed for anaphylaxis. 03/18/21   Camellia Caves, MD  ondansetron  (ZOFRAN ) 4 MG tablet Take 1 tablet (4 mg total) by mouth every 6 (six) hours as needed for nausea. 09/14/22   Pokhrel, Laxman, MD  triamcinolone cream (KENALOG) 0.1 % Apply 1 Application topically daily as needed (for hives). 02/17/21   [provider]  VENTOLIN  HFA 108 (90 Base) MCG/ACT inhaler INHALE 2 PUFFS INTO THE LUNGS EVERY 4 (FOUR) HOURS AS NEEDED FOR WHEEZING OR SHORTNESS OF BREATH (COUGHING FITS). Patient taking differently: Inhale  2 puffs into the lungs every 4 (four) hours as needed for wheezing or shortness of breath. 10/25/21   Trudy Fusi, DO    Family History Family History  Problem Relation Age of Onset   Asthma Mother    Asthma Father    Eczema Sister    Asthma Brother    Allergic rhinitis Neg Hx    Angioedema Neg Hx    Immunodeficiency Neg Hx    Urticaria Neg Hx     Social History Social History   Tobacco Use   Smoking status: Never   Smokeless  tobacco: Never  Vaping Use   Vaping status: Never Used  Substance Use Topics   Alcohol use: No   Drug use: Not Currently    Types: Marijuana     Allergies   Soy allergy (obsolete)   Review of Systems Review of Systems Per HPI  Physical Exam Triage Vital Signs ED Triage Vitals  Encounter Vitals Group     BP 08/18/23 0903 122/82     Systolic BP Percentile --      Diastolic BP Percentile --      Pulse Rate 08/18/23 0903 95     Resp 08/18/23 0903 18     Temp 08/18/23 0903 97.9 F (36.6 C)     Temp Source 08/18/23 0903 Oral     SpO2 08/18/23 0903 97 %     Weight 08/18/23 0902 250 lb (113.4 kg)     Height --      Head Circumference --      Peak Flow --      Pain Score 08/18/23 0901 8     Pain Loc --      Pain Education --      Exclude from Growth Chart --    No data found.  Updated Vital Signs BP 122/82 (BP Location: Right Arm)   Pulse 95   Temp 97.9 F (36.6 C) (Oral)   Resp 18   Wt 250 lb (113.4 kg)   LMP 08/03/2023 (Approximate)   SpO2 97%   BMI 40.35 kg/m   Visual Acuity Right Eye Distance:   Left Eye Distance:   Bilateral Distance:    Right Eye Near:   Left Eye Near:    Bilateral Near:     Physical Exam Vitals and nursing note reviewed.  Constitutional:      General: She is not in acute distress.    Appearance: Normal appearance.  HENT:     Mouth/Throat:     Pharynx: Oropharynx is clear.  Cardiovascular:     Rate and Rhythm: Normal rate and regular rhythm.     Heart sounds: Normal heart sounds.  Pulmonary:     Effort: Pulmonary effort is normal.     Breath sounds: Normal breath sounds.  Neurological:     Mental Status: She is alert and oriented to person, place, and time.     Comments: Strength and sensation intact throughout      UC Treatments / Results  Labs (all labs ordered are listed, but only abnormal results are displayed) Labs Reviewed - No data to display  EKG   Radiology No results found.  Procedures Procedures  (including critical care time)  Medications Ordered in UC Medications - No data to display  Initial Impression / Assessment and Plan / UC Course  I have reviewed the triage vital signs and the nursing notes.  Pertinent labs & imaging results that were available during my care of the  patient were reviewed by me and considered in my medical decision making (see chart for details).  Amb referral to [place] [facility] is placed. I have provided patient with [facility] employee health and wellness clinic information for follow-up as well.  Discussed all Worker's Compensation follow-ups will need to be through this clinic.  If she requires any work restrictions or short-term disability/FMLA, will need to be done at that site.  Final Clinical Impressions(s) / UC Diagnoses   Final diagnoses:  Back pain, unspecified back location, unspecified back pain laterality, unspecified chronicity     Discharge Instructions      Please go to employee health & wellness for workers compensation follow up  I have placed a referral to physical therapy and the clinic should contact you to schedule     ED Prescriptions   None    PDMP not reviewed this encounter.   Creighton Doffing, [place] 08/18/23 [id number]

## 2023-08-18 NOTE — ED Triage Notes (Signed)
 Pt presents seeking referral for physical therapy following a forklift accident at work in late March. This is not initial visit.

## 2023-08-18 NOTE — Discharge Instructions (Signed)
 Please go to employee health & wellness for workers compensation follow up  I have placed a referral to physical therapy and the clinic should contact you to schedule

## 2024-02-04 ENCOUNTER — Other Ambulatory Visit: Payer: Self-pay | Admitting: Orthopedic Surgery

## 2024-02-04 DIAGNOSIS — M5459 Other low back pain: Secondary | ICD-10-CM

## 2024-02-21 ENCOUNTER — Ambulatory Visit
Admission: RE | Admit: 2024-02-21 | Discharge: 2024-02-21 | Disposition: A | Discharge status: Home / Self Care | Source: Ambulatory Visit | Attending: Orthopedic Surgery | Admitting: Orthopedic Surgery

## 2024-02-21 DIAGNOSIS — M5459 Other low back pain: Secondary | ICD-10-CM

## 2024-04-02 ENCOUNTER — Encounter (HOSPITAL_COMMUNITY): Payer: Self-pay | Admitting: Emergency Medicine

## 2024-04-02 ENCOUNTER — Emergency Department (HOSPITAL_COMMUNITY)

## 2024-04-02 ENCOUNTER — Other Ambulatory Visit: Payer: Self-pay

## 2024-04-02 ENCOUNTER — Emergency Department (HOSPITAL_COMMUNITY)
Admission: EM | Admit: 2024-04-02 | Discharge: 2024-04-03 | Disposition: A | Discharge status: Home / Self Care | Attending: Emergency Medicine | Admitting: Emergency Medicine

## 2024-04-02 DIAGNOSIS — R519 Headache, unspecified: Secondary | ICD-10-CM | POA: Diagnosis present

## 2024-04-02 DIAGNOSIS — I10 Essential (primary) hypertension: Secondary | ICD-10-CM | POA: Insufficient documentation

## 2024-04-02 DIAGNOSIS — R079 Chest pain, unspecified: Secondary | ICD-10-CM | POA: Insufficient documentation

## 2024-04-02 LAB — CBC
HCT: 36.9 % (ref 36.0–46.0)
Hemoglobin: 12.5 g/dL (ref 12.0–15.0)
MCH: 28.5 pg (ref 26.0–34.0)
MCHC: 33.9 g/dL (ref 30.0–36.0)
MCV: 84.1 fL (ref 80.0–100.0)
Platelets: 317 K/uL (ref 150–400)
RBC: 4.39 MIL/uL (ref 3.87–5.11)
RDW: 13.2 % (ref 11.5–15.5)
WBC: 9.1 K/uL (ref 4.0–10.5)
nRBC: 0 % (ref 0.0–0.2)

## 2024-04-02 LAB — LIPASE, BLOOD: Lipase: 38 U/L (ref 11–51)

## 2024-04-02 LAB — COMPREHENSIVE METABOLIC PANEL WITH GFR
ALT: <5 U/L (ref 0–44)
AST: 17 U/L (ref 15–41)
Albumin: 3.9 g/dL (ref 3.5–5.0)
Alkaline Phosphatase: 89 U/L (ref 38–126)
Anion gap: 9 (ref 5–15)
BUN: 7 mg/dL (ref 6–20)
CO2: 24 mmol/L (ref 22–32)
Calcium: 9 mg/dL (ref 8.9–10.3)
Chloride: 105 mmol/L (ref 98–111)
Creatinine, Ser: 0.75 mg/dL (ref 0.44–1.00)
GFR, Estimated: >60 mL/min (ref >60)
Glucose, Bld: 101 mg/dL — ABNORMAL HIGH (ref 70–99)
Potassium: 3.8 mmol/L (ref 3.5–5.1)
Sodium: 138 mmol/L (ref 135–145)
Total Bilirubin: 0.6 mg/dL (ref 0.0–1.2)
Total Protein: 7.4 g/dL (ref 6.5–8.1)

## 2024-04-02 LAB — HCG, SERUM, QUALITATIVE: Preg, Serum: NEGATIVE

## 2024-04-02 LAB — TROPONIN T, HIGH SENSITIVITY: Troponin T High Sensitivity: <15 ng/L (ref 0–19)

## 2024-04-02 MED ORDER — KETOROLAC TROMETHAMINE 30 MG/ML IJ SOLN
30.0000 mg | Freq: Once | INTRAMUSCULAR | Status: AC
  Administered 2024-04-02: 23:00:00 30 mg via INTRAVENOUS
  Filled 2024-04-02: qty 1

## 2024-04-02 MED ORDER — METOCLOPRAMIDE HCL 5 MG/ML IJ SOLN
10.0000 mg | Freq: Once | INTRAMUSCULAR | Status: AC
  Administered 2024-04-02: 23:00:00 10 mg via INTRAVENOUS
  Filled 2024-04-02: qty 2

## 2024-04-02 MED ORDER — DIPHENHYDRAMINE HCL 50 MG/ML IJ SOLN
25.0000 mg | Freq: Once | INTRAMUSCULAR | Status: AC
  Administered 2024-04-02: 23:00:00 25 mg via INTRAVENOUS
  Filled 2024-04-02: qty 1

## 2024-04-02 NOTE — ED Provider Notes (Signed)
**Monica Monica**  Monica Monica   CSN: [id number] Arrival date & time: 04/02/24  2104     Patient presents with: Headache   Monica Monica is a 23 y.o. female history of intracranial hypertension, here presenting with headache and chest pain.  Patient noticed some saline smell and drips from her nose for the last several days.  Patient has some headache and blurry vision as well.  Patient is concern for possible CSF leak.  Patient also had some chest pain today as well.   The history is provided by the patient.       Prior to Admission medications  Medication Sig Start Date End Date Taking? Authorizing Provider  acetaZOLAMIDE  (DIAMOX ) 250 MG tablet Take 2 tablets (500 mg total) by mouth 3 (three) times daily. 10/17/22 01/10/24  Penumalli, Vikram R, MD  albuterol  (PROVENTIL ) (2.5 MG/3ML) 0.083% nebulizer solution Take 3 mLs (2.5 mg total) by nebulization every 4 (four) hours as needed for wheezing or shortness of breath (coughing fits). 04/05/21   Luke Orlan HERO, DO  cetirizine  (ZYRTEC  ALLERGY) 10 MG tablet Take 1 tablet (10 mg total) by mouth 2 (two) times daily. Patient taking differently: Take 10 mg by mouth 2 (two) times daily as needed for allergies. 04/05/21   Luke Orlan HERO, DO  cyclobenzaprine (FLEXERIL) 5 MG tablet Take 1 tablet twice a day by oral route as needed, for neck pain. 06/27/23   [provider]  EPINEPHrine  0.3 mg/0.3 mL IJ SOAJ injection Inject 0.3 mg into the muscle as needed for anaphylaxis. 03/18/21   Schuyler Charlie RAMAN, MD  norgestimate-ethinyl estradiol (ORTHO-CYCLEN) 0.25-35 MG-MCG tablet Take 1 tablet by mouth daily. 03/07/23   [provider]  ondansetron  (ZOFRAN ) 4 MG tablet Take 1 tablet (4 mg total) by mouth every 6 (six) hours as needed for nausea. 09/14/22   Pokhrel, Laxman, MD  triamcinolone cream (KENALOG) 0.1 % Apply 1 Application topically daily as needed (for hives). 02/17/21   [provider]   VENTOLIN  HFA 108 (90 Base) MCG/ACT inhaler INHALE 2 PUFFS INTO THE LUNGS EVERY 4 (FOUR) HOURS AS NEEDED FOR WHEEZING OR SHORTNESS OF BREATH (COUGHING FITS). Patient taking differently: Inhale 2 puffs into the lungs every 4 (four) hours as needed for wheezing or shortness of breath. 10/25/21   Luke Orlan HERO, DO    Allergies: Soy allergy (obsolete)    Review of Systems  Neurological:  Positive for headaches.  All other systems reviewed and are negative.   Updated Vital Signs BP 139/85   Pulse 93   Temp (!) 97.3 F (36.3 C) (Oral)   Resp 12   SpO2 100%   Physical Exam Vitals and nursing Monica reviewed.  Constitutional:      Appearance: She is well-developed.  HENT:     Head: Normocephalic.     Mouth/Throat:     Mouth: Mucous membranes are moist.  Eyes:     Extraocular Movements: Extraocular movements intact.     Pupils: Pupils are equal, round, and reactive to light.  Cardiovascular:     Rate and Rhythm: Normal rate and regular rhythm.     Heart sounds: Normal heart sounds.  Pulmonary:     Effort: Pulmonary effort is normal.     Breath sounds: Normal breath sounds.  Abdominal:     General: Bowel sounds are normal.     Palpations: Abdomen is soft.  Musculoskeletal:     Cervical back: Normal range of motion and neck  supple.  Skin:    General: Skin is warm.  Neurological:     Mental Status: She is alert and oriented to person, place, and time.     GCS: GCS eye subscore is 4. GCS verbal subscore is 4. GCS motor subscore is 4.  Psychiatric:        Mood and Affect: Mood normal.        Behavior: Behavior normal.     (all labs ordered are listed, but only abnormal results are displayed) Labs Reviewed  COMPREHENSIVE METABOLIC PANEL WITH GFR - Abnormal; Notable for the following components:      Result Value   Glucose, Bld 101 (*)    All other components within normal limits  LIPASE, BLOOD  CBC  HCG, SERUM, QUALITATIVE  URINALYSIS, ROUTINE W REFLEX MICROSCOPIC  TROPONIN  T, HIGH SENSITIVITY    EKG: EKG Interpretation Date/Time:  Wednesday April 02 2024 21:20:29 EST Ventricular Rate:  94 PR Interval:  148 QRS Duration:  84 QT Interval:  327 QTC Calculation: 409 R Axis:   80  Text Interpretation: Sinus rhythm Borderline repolarization abnormality No significant change since last tracing Confirmed by Patt Alm DEL [phone]) on 04/02/2024 9:24:12 PM  Radiology: No results found.   Procedures   Medications Ordered in the ED  metoCLOPramide  (REGLAN ) injection 10 mg (has no administration in time range)  diphenhydrAMINE  (BENADRYL ) injection 25 mg (has no administration in time range)  ketorolac  (TORADOL ) 30 MG/ML injection 30 mg (has no administration in time range)                                    Medical Decision Making Monica Monica is a 23 y.o. female here presenting with headache and chest pain and drainage from the nose.  Patient concern for possible CSF leak.  Patient has no signs of nose trauma.  Will get a CT head to look for intracranial hypertension or nasal bone fracture.  Will check labs and give migraine cocktail as well.  Low suspicion for ACS and symptom for about 24 hours so 1 set of troponin sufficient.  11:28 PM CBC and CMP and troponin negative.  Chest x-ray clear and CT head unremarkable.  Feeling better after migraine cocktail.  Discussed with Luke from neurosurgery she states that patient can follow-up outpatient with neurosurgery for further workup as needed..     Problems Addressed: Chest pain, unspecified type: acute illness or injury Nonintractable headache, unspecified chronicity pattern, unspecified headache type: acute illness or injury  Amount and/or Complexity of Data Reviewed Labs: ordered. Decision-making details documented in ED Course. Radiology: ordered and independent interpretation performed. Decision-making details documented in ED Course.  Risk Prescription drug management.     Final diagnoses:   None    ED Discharge Orders     None          Patt Alm Macho, MD 04/02/24 2329

## 2024-04-02 NOTE — ED Triage Notes (Signed)
 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  Reports leaking CSF from her nose  Headaches, nausea, chest pain, hand and feet swelling.

## 2024-04-02 NOTE — Discharge Instructions (Addendum)
 Please continue taking your Diamox .  Follow-up with Dr. Donita your neurologist.  I have referred you to [facility] for follow-up as well  Take Tylenol  and Motrin  for headache  Return to ER if you have worse headache or worst CSF drainage

## 2024-04-03 ENCOUNTER — Telehealth: Payer: Self-pay | Admitting: Diagnostic Neuroimaging

## 2024-04-03 NOTE — Telephone Encounter (Signed)
 IIH f/u from recent hospitalization

## 2024-04-03 NOTE — ED Provider Notes (Signed)
 Patient signed out pending CTA of the head.  This is negative for acute abnormality.  Patient discharged per Dr. Leva d/c instructions.   Monica Charmaine FALCON, MD 04/03/24 [phone]

## 2024-05-14 ENCOUNTER — Ambulatory Visit (INDEPENDENT_AMBULATORY_CARE_PROVIDER_SITE_OTHER): Admitting: Diagnostic Neuroimaging

## 2024-05-14 ENCOUNTER — Encounter: Payer: Self-pay | Admitting: Diagnostic Neuroimaging

## 2024-05-14 VITALS — BP 116/79 | HR 97 | Ht 66.0 in | Wt 258.6 lb

## 2024-05-14 DIAGNOSIS — R51 Headache with orthostatic component, not elsewhere classified: Secondary | ICD-10-CM

## 2024-05-14 DIAGNOSIS — Z6841 Body Mass Index (BMI) 40.0 and over, adult: Secondary | ICD-10-CM | POA: Diagnosis not present

## 2024-05-14 DIAGNOSIS — G932 Benign intracranial hypertension: Secondary | ICD-10-CM | POA: Diagnosis not present

## 2024-05-14 NOTE — Progress Notes (Signed)
 "  [facility]  PATIENT: Monica [name] DOB: 17-Sep-2000  REFERRING CLINICIAN: [facility] HISTORY FROM: patient and mother  REASON FOR VISIT: new consult (follow up)   HISTORICAL  CHIEF COMPLAINT:  Chief Complaint  Patient presents with   Follow-up    Patient in room 7 with mom. Patient is here for follow up, patient states that issues are worsening, stiffness in neck, vomiting, including pressure in opposite eye, patient has been to eye doctor, vision in left eye has gotten worse, incredibly tired, sleeping more frequently. Patient stated Diamox  is making symptoms worse.      HISTORY OF PRESENT ILLNESS:    UPDATE (05/14/24, VRP): Since last visit, lost to follow up. Was on diamox  for few months then stopped in early 2025. Only restarted 1 month ago after seeing eye doctor again.   PRIOR HPI (09/25/22, VRP): 24 year old female here for evaluation of idiopathic intracranial hypertension.  3 weeks ago patient had onset of right eye vision loss.  She was laying down with her left eye closed and noticed that her right eye was not able to see properly.  She went to sleep and woke up the next day with persistent symptoms.  She went to ER for evaluation.  She was referred to ophthalmology who referred patient back to ER for emergent evaluation due to bilateral papilledema.  MRI brain, MRI orbits obtained and unremarkable.  Had lumbar puncture demonstrating opening pressure elevation of 30 cmH2O.  Started on acetazolamide .  Has been on this for the last 1-1/2 weeks.  Symptoms are stable.  No improvement or worsening.  Has noted some dull headaches since ER evaluation.  Patient has had 20 to 30 pound weight gain over the past year.  She is planning to see weight management clinic tomorrow.  Has follow-up eye exam later this week.    REVIEW OF SYSTEMS: Full 14 system review of systems performed and negative with exception of: as per  HPI.  ALLERGIES: Allergies  Allergen Reactions   Soy Allergy (Obsolete) Hives    HOME MEDICATIONS: Outpatient Medications Prior to Visit  Medication Sig Dispense Refill   acetaZOLAMIDE  ER (DIAMOX ) 500 MG capsule Take 500 mg by mouth 2 (two) times daily.     albuterol  (PROVENTIL ) (2.5 MG/3ML) 0.083% nebulizer solution Take 3 mLs (2.5 mg total) by nebulization every 4 (four) hours as needed for wheezing or shortness of breath (coughing fits). 75 mL 1   EPINEPHrine  0.3 mg/0.3 mL IJ SOAJ injection Inject 0.3 mg into the muscle as needed for anaphylaxis. 1 each 0   VENTOLIN  HFA 108 (90 Base) MCG/ACT inhaler INHALE 2 PUFFS INTO THE LUNGS EVERY 4 (FOUR) HOURS AS NEEDED FOR WHEEZING OR SHORTNESS OF BREATH (COUGHING FITS). 18 each 1   acetaZOLAMIDE  (DIAMOX ) 250 MG tablet Take 2 tablets (500 mg total) by mouth 3 (three) times daily. 540 tablet 4   cetirizine  (ZYRTEC  ALLERGY) 10 MG tablet Take 1 tablet (10 mg total) by mouth 2 (two) times daily. (Patient not taking: Reported on 05/14/2024) 60 tablet 2   cyclobenzaprine (FLEXERIL) 5 MG tablet Take 1 tablet twice a day by oral route as needed, for neck pain. (Patient not taking: Reported on 05/14/2024)     norgestimate-ethinyl estradiol (ORTHO-CYCLEN) 0.25-35 MG-MCG tablet Take 1 tablet by mouth daily. (Patient not taking: Reported on 05/14/2024)     ondansetron  (ZOFRAN ) 4 MG tablet Take 1 tablet (4 mg total) by mouth every 6 (six) hours as needed  for nausea. (Patient not taking: Reported on 05/14/2024) 20 tablet 0   triamcinolone cream (KENALOG) 0.1 % Apply 1 Application topically daily as needed (for hives). (Patient not taking: Reported on 05/14/2024)     No facility-administered medications prior to visit.    PAST MEDICAL HISTORY: Past Medical History:  Diagnosis Date   Asthma    Eczema    Urticaria 04/05/2021    PAST SURGICAL HISTORY: Past Surgical History:  Procedure Laterality Date   ORIF ANKLE FRACTURE Right 09/03/2018   Procedure: RIGHT  OPEN REDUCTION INTERNAL FIXATION (ORIF) ANKLE FRACTURE;  Surgeon: Addie Cordella Hamilton, MD;  Location: [facility] OR;  Service: Orthopedics;  Laterality: Right;   WISDOM TOOTH EXTRACTION  11/2016    FAMILY HISTORY: Family History  Problem Relation Age of Onset   Asthma Mother    Asthma Father    Eczema Sister    Asthma Brother    Allergic rhinitis Neg Hx    Angioedema Neg Hx    Immunodeficiency Neg Hx    Urticaria Neg Hx     SOCIAL HISTORY: Social History   Socioeconomic History   Marital status: Single    Spouse name: Not on file   Number of children: Not on file   Years of education: Not on file   Highest education level: Not on file  Occupational History   Not on file  Tobacco Use   Smoking status: Never   Smokeless tobacco: Never  Vaping Use   Vaping status: Every Day   Substances: Nicotine  Substance and Sexual Activity   Alcohol use: No   Drug use: Not Currently    Types: Marijuana   Sexual activity: Never  Other Topics Concern   Not on file  Social History Narrative   Patient does live alone, patient is currently on workers comp and applying for disability (05/14/2024)   Social Drivers of Health   Tobacco Use: Low Risk (05/14/2024)   Patient History    Smoking Tobacco Use: Never    Smokeless Tobacco Use: Never    Passive Exposure: Not on file  Financial Resource Strain: Not on file  Food Insecurity: Not on file  Transportation Needs: Not on file  Physical Activity: Not on file  Stress: Not on file  Social Connections: Not on file  Intimate Partner Violence: Not on file  Depression (EYV7-0): Not on file  Alcohol Screen: Not on file  Housing: Not on file  Utilities: Not on file  Health Literacy: Not on file     PHYSICAL EXAM  GENERAL EXAM/CONSTITUTIONAL: Vitals:  Vitals:   05/14/24 1415  BP: 116/79  Pulse: 97  Weight: 258 lb 9.6 oz (117.3 kg)  Height: 5' 6 (1.676 m)   Body mass index is 41.74 kg/m. Wt Readings from Last 3 Encounters:   05/14/24 258 lb 9.6 oz (117.3 kg)  08/18/23 250 lb (113.4 kg)  05/24/23 250 lb (113.4 kg)   Patient is in no distress; well developed, nourished and groomed; neck is supple  CARDIOVASCULAR: Examination of carotid arteries is normal; no carotid bruits Regular rate and rhythm, no murmurs Examination of peripheral vascular system by observation and palpation is normal  EYES: Ophthalmoscopic exam of optic discs and posterior segments is normal; no papilledema or hemorrhages No results found.  MUSCULOSKELETAL: Gait, strength, tone, movements noted in Neurologic exam below  NEUROLOGIC: MENTAL STATUS:      No data to display         awake, alert, oriented to person, place and  time recent and remote memory intact normal attention and concentration language fluent, comprehension intact, naming intact fund of knowledge appropriate  CRANIAL NERVE:  2nd - RIGHT OPTIC NERVE MARGIN DISTINCT; LEFT OPTIC NERVE DISC MARGIN BLURRED 2nd, 3rd, 4th, 6th - PUPILS REACTIVE; visual fields full to confrontation, extraocular muscles intact, no nystagmus 5th - facial sensation symmetric 7th - facial strength symmetric 8th - hearing intact 9th - palate elevates symmetrically, uvula midline 11th - shoulder shrug symmetric 12th - tongue protrusion midline  MOTOR:  normal bulk and tone, full strength in the BUE, BLE  SENSORY:  normal and symmetric to light touch, temperature, vibration  COORDINATION:  finger-nose-finger, fine finger movements normal  REFLEXES:  deep tendon reflexes TRACE and symmetric  GAIT/STATION:  narrow based gait     DIAGNOSTIC DATA (LABS, IMAGING, TESTING) - I reviewed patient records, labs, notes, testing and imaging myself where available.  Lab Results  Component Value Date   WBC 9.1 04/02/2024   HGB 12.5 04/02/2024   HCT 36.9 04/02/2024   MCV 84.1 04/02/2024   PLT 317 04/02/2024      Component Value Date/Time   NA 138 04/02/2024 2149   NA 142  04/05/2021 1624   K 3.8 04/02/2024 2149   CL 105 04/02/2024 2149   CO2 24 04/02/2024 2149   GLUCOSE 101 (H) 04/02/2024 2149   BUN 7 04/02/2024 2149   BUN 11 04/05/2021 1624   CREATININE 0.75 04/02/2024 2149   CALCIUM 9.0 04/02/2024 2149   PROT 7.4 04/02/2024 2149   PROT 6.1 04/05/2021 1624   ALBUMIN 3.9 04/02/2024 2149   ALBUMIN 3.8 (L) 04/05/2021 1624   AST 17 04/02/2024 2149   ALT <5 04/02/2024 2149   ALKPHOS 89 04/02/2024 2149   BILITOT 0.6 04/02/2024 2149   BILITOT 0.2 04/05/2021 1624   GFRNONAA >60 04/02/2024 2149   GFRAA >60 11/28/2019 2108   Lab Results  Component Value Date   CHOL 127 02/09/2017   HDL 32 (L) 02/09/2017   LDLCALC 67 02/09/2017   TRIG 140 02/09/2017   CHOLHDL 4.0 02/09/2017   No results found for: HGBA1C No results found for: VITAMINB12 Lab Results  Component Value Date   TSH 2.978 02/09/2017     09/12/22 Unremarkable MRI of the brain and orbits with no acute finding.  09/13/22 LP Clear colorless CSF spontaneously returned, with opening pressure of 30 cm water. Technically successful lumbar puncture under fluoroscopic guidance.     ASSESSMENT AND PLAN  24 y.o. year old female here with:   Dx:  1. IIH (idiopathic intracranial hypertension)   2. Positional headache   3. BMI 40.0-44.9, adult (HCC)     PLAN:  idiopathic intracranial hypertension (pseudotumor cerebri)  - continue acetazolamide  500mg ; reduce to twice a day (due to side effects) - refer to weight mgmt clinic  - follow up with eye clinic (Dr. Waylan) - refer to sleep studies (excessive daytime fatigue; rule out OSA)  POSITIONAL HEADACHE (with clear rhinorrhea) - check MRI brain w/wo (rule out CSF leak)  Orders Placed This Encounter  Procedures   MR BRAIN W WO CONTRAST   Ambulatory referral to [facility]   Ambulatory referral to Sleep Studies   Return in about 4 months (around 09/11/2024) for MyChart visit (15 min).    EDUARD FABIENE HANLON, MD 05/14/2024,  3:15 PM Certified in Neurology, Neurophysiology and Neuroimaging  [facility] [address], [address] [place], [place] [address] [phone]  "

## 2024-05-19 ENCOUNTER — Telehealth: Payer: Self-pay | Admitting: Diagnostic Neuroimaging

## 2024-05-19 NOTE — Telephone Encounter (Signed)
 [facility] [place]: [id number] exp. 05/19/24-08/16/24 scheduled at GI on 2/27.

## 2024-05-21 ENCOUNTER — Encounter (INDEPENDENT_AMBULATORY_CARE_PROVIDER_SITE_OTHER): Payer: Self-pay

## 2024-06-13 ENCOUNTER — Other Ambulatory Visit

## 2024-09-15 ENCOUNTER — Telehealth: Admitting: Diagnostic Neuroimaging
